# Patient Record
Sex: Female | Born: 1960 | Race: White | Hispanic: No | Marital: Single | Smoking: Never smoker
Health system: Southern US, Community
[De-identification: ages and names within clinical notes are randomized; demographics above are authoritative.]

## PROBLEM LIST (undated history)

## (undated) DIAGNOSIS — E119 Type 2 diabetes mellitus without complications: Secondary | ICD-10-CM

---

## 2005-04-26 ENCOUNTER — Ambulatory Visit (HOSPITAL_COMMUNITY): Admission: RE | Admit: 2005-04-26 | Discharge: 2005-04-26 | Payer: Self-pay | Admitting: Family Medicine

## 2014-02-22 ENCOUNTER — Ambulatory Visit (INDEPENDENT_AMBULATORY_CARE_PROVIDER_SITE_OTHER): Payer: Self-pay | Admitting: Family Medicine

## 2014-02-22 ENCOUNTER — Encounter: Payer: Self-pay | Admitting: Family Medicine

## 2014-02-22 VITALS — BP 144/94 | Temp 98.3°F | Ht 65.0 in | Wt 218.0 lb

## 2014-02-22 DIAGNOSIS — M549 Dorsalgia, unspecified: Secondary | ICD-10-CM

## 2014-02-22 MED ORDER — CHLORZOXAZONE 500 MG PO TABS: 500.0000 mg | ORAL_TABLET | Freq: Three times a day (TID) | ORAL | Status: DC | PRN

## 2014-02-22 NOTE — Progress Notes (Signed)
   Subjective:    Patient ID: Michele Cook, female    DOB: 08/10/1961, 53 y.o.   MRN: [id number]  Shoulder Pain  The pain is present in the left shoulder. This is a new problem. Episode onset: Sunday. There has been no history of extremity trauma (She sneezed, and that is when it started to hurt). The problem occurs daily. Associated symptoms include a limited range of motion. The symptoms are aggravated by standing and activity. She has tried NSAIDS and heat for the symptoms. The treatment provided moderate relief.   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 Lungs clear. Heart regular in rhythm. Positive. Scapular tenderness to deep palpation.      Assessment & Plan:  Impression posterior thoracic strain plan chlorzoxazone 500 3 times a day. Anti-inflammatory medicines. Local measures discussed. Recheck her persists. WSL

## 2014-03-01 ENCOUNTER — Telehealth: Payer: Self-pay | Admitting: *Deleted

## 2014-03-01 MED ORDER — NABUMETONE 750 MG PO TABS: 750.0000 mg | ORAL_TABLET | Freq: Every day | ORAL | Status: DC

## 2014-03-01 NOTE — Telephone Encounter (Signed)
Seen last week for shoulder pain. Taking chlorazone 500 tid. Now having pain in neck and running down left arm. Using heating pain. Med not helping. Please advise. [facility].

## 2014-03-01 NOTE — Telephone Encounter (Signed)
Add relafen 750 bid 28 take no other anti inflam otc

## 2014-03-01 NOTE — Telephone Encounter (Signed)
Discussed with patient. Med sent to pharm.  

## 2014-03-03 ENCOUNTER — Telehealth: Payer: Self-pay | Admitting: Family Medicine

## 2014-03-03 DIAGNOSIS — M25512 Pain in left shoulder: Secondary | ICD-10-CM

## 2014-03-03 NOTE — Telephone Encounter (Signed)
Seen on 02/22/14 for shoulder pain

## 2014-03-03 NOTE — Telephone Encounter (Signed)
Pt called concerned with ongoing pain in L shoulder, sometimes burning, making her have nausea, unable to sleep or to bend over with out pain, has been taking the Relafen since Monday, frustrated that she doesn't seem to be healing, wonders if she needs X-ray,

## 2014-03-04 ENCOUNTER — Telehealth: Payer: Self-pay | Admitting: Family Medicine

## 2014-03-04 MED ORDER — TIZANIDINE HCL 4 MG PO TABS: 4.0000 mg | ORAL_TABLET | Freq: Three times a day (TID) | ORAL | Status: DC | PRN

## 2014-03-04 MED ORDER — ETODOLAC 400 MG PO TABS: 400.0000 mg | ORAL_TABLET | Freq: Two times a day (BID) | ORAL | Status: DC

## 2014-03-04 NOTE — Telephone Encounter (Signed)
lodine 400 bid for 14 d , zanaflex 4 mg numb thirty one up to tid prn, ck in office if persist

## 2014-03-04 NOTE — Telephone Encounter (Signed)
Last seen 03/17, prescribed chloraxazone and relafen

## 2014-03-04 NOTE — Telephone Encounter (Signed)
Patient says that she is still having shoulder and top back pain. She says the medication she was prescribed is not helping too much She feels i from her shoulder to her elbow and here lately its been going down into her side. She said the pain comes and goes and she is sometimes restless at night because of it. Please advise.

## 2014-03-04 NOTE — Telephone Encounter (Signed)
[facility]. Patient was notified.

## 2014-03-11 NOTE — Telephone Encounter (Signed)
This got misplacdd in the shuffle i'm not sure we ever called bk. Call and see how shoulder is doing, if still sig painful would rec xray

## 2014-03-14 NOTE — Telephone Encounter (Signed)
Ok x ray should

## 2014-03-14 NOTE — Telephone Encounter (Signed)
Pain is better but it is still there. She only takes zanaflex once at bedtime instead of TID because it makes her sleepy. Taking etodolac BID. Pt does want to get xray.

## 2014-03-14 NOTE — Telephone Encounter (Signed)
Left shoulder xray order put in. Pt [facility] and [facility] done.

## 2014-03-15 ENCOUNTER — Ambulatory Visit (HOSPITAL_COMMUNITY)
Admission: RE | Admit: 2014-03-15 | Discharge: 2014-03-15 | Disposition: A | Discharge status: Home / Self Care | Payer: Self-pay | Source: Ambulatory Visit | Attending: Family Medicine | Admitting: Family Medicine

## 2014-03-15 DIAGNOSIS — M25519 Pain in unspecified shoulder: Secondary | ICD-10-CM | POA: Insufficient documentation

## 2014-03-16 ENCOUNTER — Telehealth: Payer: Self-pay | Admitting: Family Medicine

## 2014-03-16 NOTE — Telephone Encounter (Signed)
Calling to get results to xray °

## 2014-03-17 NOTE — Telephone Encounter (Signed)
We've not seen in three wks. Call pt advise ov with me this aft or Dr Lorin Picketscott tomorrow

## 2014-03-17 NOTE — Telephone Encounter (Signed)
Pt states she is still in a great deal of pain, almost to much to deal with last night.  Now has ache or burn like feeling going down her arm to her elbow, up through to her chest into her  Breast area, taking her breathe away at moments. Family history of Heart Failure, Cancer, Diabetes, so she is of great concern at this point. Her BP has been slightly elevated since all this pain has started. Last check was 132/92   Also has nausea with it  Feels the medications are helping to a point. Only takes the muscle spasms pill at night before bed because it makes her sluggish. Etodolac is working she assumes, but the pain isn't really going away.   [facility] [place]

## 2014-03-17 NOTE — Telephone Encounter (Signed)
Patient scheduled appointment for tomorrow at front desk.

## 2014-03-18 ENCOUNTER — Encounter: Payer: Self-pay | Admitting: Family Medicine

## 2014-03-18 ENCOUNTER — Ambulatory Visit (INDEPENDENT_AMBULATORY_CARE_PROVIDER_SITE_OTHER): Payer: Self-pay | Admitting: Family Medicine

## 2014-03-18 VITALS — BP 132/90 | Ht 65.0 in | Wt 217.0 lb

## 2014-03-18 DIAGNOSIS — M5010 Cervical disc disorder with radiculopathy, unspecified cervical region: Secondary | ICD-10-CM

## 2014-03-18 DIAGNOSIS — M5412 Radiculopathy, cervical region: Secondary | ICD-10-CM

## 2014-03-18 DIAGNOSIS — R030 Elevated blood-pressure reading, without diagnosis of hypertension: Secondary | ICD-10-CM

## 2014-03-18 MED ORDER — NORTRIPTYLINE HCL 10 MG PO CAPS: 10.0000 mg | ORAL_CAPSULE | Freq: Every day | ORAL | Status: DC

## 2014-03-18 NOTE — Progress Notes (Addendum)
   Subjective:    Patient ID: Michele Cook, female    DOB: 06/02/1961, 53 y.o.   MRN: [id number]  HPI Patient is here today for a repeat visit from 3/17.   She has been dealing with left, shoulder pain for 3 weeks now.  It hurts her w/ or w/o activity. It hurts for her to bend over, or lift her arm. Worse with movement. Pain @ level 8  She can touch and tell you where in her shoulder it hurts. It hurts to the touch.  This all happened when she blew her nose 3 weeks ago.  The heating pad helps.  The patient denies any rash she does states is worse with movement but the pain goes from the trapezius into the shoulder down into triceps down into the arm sometimes into the hand she denies any unilateral numbness or weakness. His made it difficult for her to do her job. She does not have insurance so therefore she does not want to have any advanced testing at this point   Review of Systems     See above. Denies breathing difficulties chest pain nausea vomiting Objective:   Physical Exam Tenderness in the trapezius with subjective discomfort down the left arm intrinsic muscles of the hands normal. No sign of any type of palsy. No spasms. Pulses are good.       Assessment & Plan:  Cervical radiculopathy-I. believe that this patient would benefit from nortriptyline at nighttime start off 10 mg each bedtime advance to 20 mg depending on how she does followup again in 4 weeks' time patient was told if she starts having significant numbness or weakness she would need to go ahead and go through testing regardless of insurance coverage. She seemed to understand this. Followup if any other troubles otherwise recheck 4 weeks  Blood pressure borderline dietary measures exercise discussed recheck blood pressure again in 4 weeks

## 2014-03-18 NOTE — Patient Instructions (Signed)
DASH Diet  The DASH diet stands for "Dietary Approaches to Stop Hypertension." It is a healthy eating plan that has been shown to reduce high blood pressure (hypertension) in as little as 14 days, while also possibly providing other significant health benefits. These other health benefits include reducing the risk of breast cancer after menopause and reducing the risk of type 2 diabetes, heart disease, colon cancer, and stroke. Health benefits also include weight loss and slowing kidney failure in patients with chronic kidney disease.   DIET GUIDELINES  · Limit salt (sodium). Your diet should contain less than 1500 mg of sodium daily.  · Limit refined or processed carbohydrates. Your diet should include mostly whole grains. Desserts and added sugars should be used sparingly.  · Include small amounts of heart-healthy fats. These types of fats include nuts, oils, and tub margarine. Limit saturated and trans fats. These fats have been shown to be harmful in the body.  CHOOSING FOODS   The following food groups are based on a 2000 calorie diet. See your Registered Dietitian for individual calorie needs.  Grains and Grain Products (6 to 8 servings daily)  · Eat More Often: Whole-wheat bread, brown rice, whole-grain or wheat pasta, quinoa, popcorn without added fat or salt (air popped).  · Eat Less Often: White bread, white pasta, white rice, cornbread.  Vegetables (4 to 5 servings daily)  · Eat More Often: Fresh, frozen, and canned vegetables. Vegetables may be raw, steamed, roasted, or grilled with a minimal amount of fat.  · Eat Less Often/Avoid: Creamed or fried vegetables. Vegetables in a cheese sauce.  Fruit (4 to 5 servings daily)  · Eat More Often: All fresh, canned (in natural juice), or frozen fruits. Dried fruits without added sugar. One hundred percent fruit juice (½ cup [237 mL] daily).  · Eat Less Often: Dried fruits with added sugar. Canned fruit in light or heavy syrup.  Lean Meats, Fish, and Poultry (2  servings or less daily. One serving is 3 to 4 oz [85-114 g]).  · Eat More Often: Ninety percent or leaner ground beef, tenderloin, sirloin. Round cuts of beef, chicken breast, turkey breast. All fish. Grill, bake, or broil your meat. Nothing should be fried.  · Eat Less Often/Avoid: Fatty cuts of meat, turkey, or chicken leg, thigh, or wing. Fried cuts of meat or fish.  Dairy (2 to 3 servings)  · Eat More Often: Low-fat or fat-free milk, low-fat plain or light yogurt, reduced-fat or part-skim cheese.  · Eat Less Often/Avoid: Milk (whole, 2%). Whole milk yogurt. Full-fat cheeses.  Nuts, Seeds, and Legumes (4 to 5 servings per week)  · Eat More Often: All without added salt.  · Eat Less Often/Avoid: Salted nuts and seeds, canned beans with added salt.  Fats and Sweets (limited)  · Eat More Often: Vegetable oils, tub margarines without trans fats, sugar-free gelatin. Mayonnaise and salad dressings.  · Eat Less Often/Avoid: Coconut oils, palm oils, butter, stick margarine, cream, half and half, cookies, candy, pie.  FOR MORE INFORMATION  The Dash Diet Eating Plan: www.dashdiet.org  Document Released: 11/14/2011 Document Revised: 02/17/2012 Document Reviewed: 11/14/2011  ExitCare® Patient Information ©2014 [employer].

## 2014-12-13 ENCOUNTER — Ambulatory Visit (INDEPENDENT_AMBULATORY_CARE_PROVIDER_SITE_OTHER): Payer: Self-pay | Admitting: Family Medicine

## 2014-12-13 ENCOUNTER — Encounter: Payer: Self-pay | Admitting: Family Medicine

## 2014-12-13 VITALS — Temp 98.6°F | Ht 65.0 in | Wt 217.2 lb

## 2014-12-13 DIAGNOSIS — J329 Chronic sinusitis, unspecified: Secondary | ICD-10-CM

## 2014-12-13 MED ORDER — CEPHALEXIN 500 MG PO CAPS: 500.0000 mg | ORAL_CAPSULE | Freq: Three times a day (TID) | ORAL | Status: DC

## 2014-12-13 NOTE — Progress Notes (Signed)
   Subjective:    Patient ID: Michele Harnesseresa C Cook, female    DOB: 02/26/1961, 54 y.o.   MRN: [id number]  Cough This is a new problem. The current episode started 1 to 4 weeks ago. Associated symptoms include headaches and nasal congestion.    mucinex and allegra and sinus med   no sig fevdr  No majpor cough thru the night  Worse after eating  No sig smoke exposure  Headache frontal in nature worse with change of position   Review of Systems  Respiratory: Positive for cough.   Neurological: Positive for headaches.       Objective:   Physical Exam  Alert mild malaise. Vital stable frontal mass or tenderness evident pharynx slight erythema neck supple lungs clear. Heart regular in rhythm.      Assessment & Plan:  Impression acute rhinosinusitis plan antibiotics prescribed. Since Medicare discussed. Warning signs discussed. WSL

## 2015-01-09 ENCOUNTER — Telehealth: Payer: Self-pay | Admitting: Family Medicine

## 2015-01-09 ENCOUNTER — Telehealth: Payer: Self-pay | Admitting: *Deleted

## 2015-01-09 ENCOUNTER — Other Ambulatory Visit: Payer: Self-pay | Admitting: *Deleted

## 2015-01-09 MED ORDER — AMOXICILLIN-POT CLAVULANATE 875-125 MG PO TABS: 1.0000 | ORAL_TABLET | Freq: Two times a day (BID) | ORAL | Status: DC

## 2015-01-09 MED ORDER — CHLORZOXAZONE 500 MG PO TABS: 500.0000 mg | ORAL_TABLET | Freq: Three times a day (TID) | ORAL | Status: DC | PRN

## 2015-01-09 NOTE — Telephone Encounter (Signed)
Was seen on 1/5 for rhinosinusitis. No fever, wheezing, nor SOB. Given Keflex.

## 2015-01-09 NOTE — Telephone Encounter (Signed)
Aug 875 bis ten d, chlorzoxazone 500 one tid prn spasm, let pt know if you spk with her coughing can cause spasm tendencies to act up

## 2015-01-09 NOTE — Telephone Encounter (Signed)
Pt requesting a cream or muscle relaxer for her back. She states she was seen last march and did xrays and could never find out what was wrong.

## 2015-01-09 NOTE — Telephone Encounter (Signed)
Patient was seen 1/5 and given prescription for cephalexin 500 mg it helped for awhile but came back and cough is worst especially at night.She states coughing up white lumps of cold can you call in something else and also want something for muscle spasms called into [facility].

## 2015-01-09 NOTE — Telephone Encounter (Signed)
Seen 12/13/14. Dx rhinosinusitis. Prescribed keflex 500 one tid. [facility] to get more info about symptoms and why pt requesting muscle relaxer.

## 2015-01-09 NOTE — Telephone Encounter (Signed)
Discussed with patient. meds sent to pharm.  

## 2015-04-12 ENCOUNTER — Encounter: Payer: Self-pay | Admitting: Nurse Practitioner

## 2015-04-12 ENCOUNTER — Ambulatory Visit (INDEPENDENT_AMBULATORY_CARE_PROVIDER_SITE_OTHER): Payer: Self-pay | Admitting: Nurse Practitioner

## 2015-04-12 VITALS — BP 138/84 | Temp 98.4°F | Ht 65.0 in | Wt 215.0 lb

## 2015-04-12 DIAGNOSIS — N912 Amenorrhea, unspecified: Secondary | ICD-10-CM

## 2015-04-12 DIAGNOSIS — Z13220 Encounter for screening for lipoid disorders: Secondary | ICD-10-CM

## 2015-04-12 DIAGNOSIS — R350 Frequency of micturition: Secondary | ICD-10-CM

## 2015-04-12 DIAGNOSIS — R5383 Other fatigue: Secondary | ICD-10-CM

## 2015-04-12 LAB — POCT URINALYSIS DIPSTICK
PH UA: 6
Spec Grav, UA: 1.015

## 2015-04-12 NOTE — Patient Instructions (Signed)
Soy Flaxseed Black cohosh 

## 2015-04-14 ENCOUNTER — Telehealth: Payer: Self-pay | Admitting: Nurse Practitioner

## 2015-04-14 DIAGNOSIS — R232 Flushing: Secondary | ICD-10-CM

## 2015-04-14 DIAGNOSIS — D649 Anemia, unspecified: Secondary | ICD-10-CM

## 2015-04-14 LAB — URINE CULTURE

## 2015-04-14 NOTE — Telephone Encounter (Signed)
Pt is calling to see if you would all her about the labs you want  Her to do, she went to get them done but she was told by [facility] (since she is self pay) that she would need to pay up front $681 in order To get any labs. She offered partial payment but was told no.   How do we help people like this?   Please advise pt

## 2015-04-14 NOTE — Telephone Encounter (Signed)
#  1: we can narrow this down as much as possible to just basic labs #2: she can try [facility] to see if they can give her a better out of pocket cost; a few of the labs may be expensive but [facility] used to give discounts on regular everyday labs.

## 2015-04-14 NOTE — Telephone Encounter (Signed)
Neither lab will draw the labs with out payment in full before they draw the blood

## 2015-04-16 ENCOUNTER — Encounter: Payer: Self-pay | Admitting: Nurse Practitioner

## 2015-04-16 NOTE — Progress Notes (Signed)
Subjective:  Presents for several complaints including urinary frequency over the past week and a half. Also having fatigue and hot flashes off-and-on for the past month. No relief with estroven. No fever. Minimal dysuria. No nausea vomiting. No constipation or diarrhea. No abdominal pain. Mild low back pain at times. Has not had a cycle in over a year. Sees Dr. Manson PasseyBrown in the summer for her physical.  Objective:   BP 138/84 mmHg  Temp(Src) 98.4 F (36.9 C) (Oral)  Ht 5\' 5"  (1.651 m)  Wt 215 lb (97.523 kg)  BMI 35.78 kg/m2 NAD. Alert, oriented. Lungs clear. Heart regular rate rhythm. No CVA or flank tenderness. Abdomen soft nondistended nontender. Results for orders placed or performed in visit on 04/12/15  Urine culture  Result Value Ref Range   Urine Culture, Routine Final report    Result 1 Comment   POCT urinalysis dipstick  Result Value Ref Range   Color, UA     Clarity, UA     Glucose, UA     Bilirubin, UA     Ketones, UA     Spec Grav, UA 1.015    Blood, UA     pH, UA 6.0    Protein, UA     Urobilinogen, UA     Nitrite, UA     Leukocytes, UA     Urine micro-negative.  Assessment: Urinary frequency - Plan: POCT urinalysis dipstick, Urine culture, Hemoglobin A1c, POCT Glucose (Device for Home Use)  Other fatigue - Plan: Hepatic function panel, Basic metabolic panel, TSH, Vit D  25 hydroxy (rtn osteoporosis monitoring), Hemoglobin A1c  Amenorrhea - Plan: CBC with Differential/Platelet, FSH, LH, Estradiol  Screening, lipid - Plan: Lipid panel  Plan: Discussed natural alternatives to HRT per patient request. Lab work pending. Encourage activity healthy diet and weight loss. Call back if symptoms worsen or persist.

## 2015-04-17 NOTE — Telephone Encounter (Signed)
Left message to return call 

## 2015-04-17 NOTE — Telephone Encounter (Signed)
This is not ideal but several of these labs are expensive. I recommend that we cut it down to the "bare bones" and get met 7, lipid profile, liver profile and TSH. Please order these if she agrees.

## 2015-04-18 NOTE — Telephone Encounter (Signed)
Add CBC with diff to labs. These are the most basic ones we run which will rule out a lot of problems.

## 2015-04-18 NOTE — Telephone Encounter (Signed)
She just wants to do the labs directly related to hot flashes and anemia-no others

## 2015-04-18 NOTE — Telephone Encounter (Signed)
Patient states she thinks she is anemic and that causing weakness- has hx in past of this and patient also very troubled by hot flashes and wants to direct blood work toward these 2 concerns

## 2015-04-21 NOTE — Telephone Encounter (Signed)
These are the most expensive out of the tests we ordered but here it goes: please reorder CBC, LH, FSH and estradiol levels. Thanks.

## 2015-04-21 NOTE — Telephone Encounter (Signed)
bw orders put in. Pt notified on voicemail.  

## 2015-04-28 ENCOUNTER — Telehealth: Payer: Self-pay | Admitting: Nurse Practitioner

## 2015-04-28 NOTE — Telephone Encounter (Signed)
Error

## 2015-05-02 LAB — HEPATIC FUNCTION PANEL
ALT: 23 IU/L (ref 0–32)
AST: 23 IU/L (ref 0–40)
Albumin: 4.2 g/dL (ref 3.5–5.5)
Alkaline Phosphatase: 89 IU/L (ref 39–117)
BILIRUBIN, DIRECT: 0.19 mg/dL (ref 0.00–0.40)
Bilirubin Total: 0.8 mg/dL (ref 0.0–1.2)
Total Protein: 6.5 g/dL (ref 6.0–8.5)

## 2015-05-02 LAB — CBC WITH DIFFERENTIAL/PLATELET
BASOS ABS: 0.1 10*3/uL (ref 0.0–0.2)
Basos: 1 %
EOS (ABSOLUTE): 0.1 10*3/uL (ref 0.0–0.4)
EOS: 2 %
HEMATOCRIT: 44.9 % (ref 34.0–46.6)
HEMOGLOBIN: 15.2 g/dL (ref 11.1–15.9)
IMMATURE GRANULOCYTES: 0 %
Immature Grans (Abs): 0 10*3/uL (ref 0.0–0.1)
LYMPHS: 32 %
Lymphocytes Absolute: 1.6 10*3/uL (ref 0.7–3.1)
MCH: 29 pg (ref 26.6–33.0)
MCHC: 33.9 g/dL (ref 31.5–35.7)
MCV: 86 fL (ref 79–97)
MONOCYTES: 7 %
MONOS ABS: 0.4 10*3/uL (ref 0.1–0.9)
NEUTROS ABS: 2.8 10*3/uL (ref 1.4–7.0)
Neutrophils: 58 %
Platelets: 218 10*3/uL (ref 150–379)
RBC: 5.24 x10E6/uL (ref 3.77–5.28)
RDW: 13.4 % (ref 12.3–15.4)
WBC: 5 10*3/uL (ref 3.4–10.8)

## 2015-05-02 LAB — BASIC METABOLIC PANEL
BUN/Creatinine Ratio: 18 (ref 9–23)
BUN: 13 mg/dL (ref 6–24)
CO2: 22 mmol/L (ref 18–29)
Calcium: 9.3 mg/dL (ref 8.7–10.2)
Chloride: 102 mmol/L (ref 97–108)
Creatinine, Ser: 0.74 mg/dL (ref 0.57–1.00)
GFR calc Af Amer: 108 mL/min/{1.73_m2} (ref >59)
GFR, EST NON AFRICAN AMERICAN: 93 mL/min/{1.73_m2} (ref >59)
GLUCOSE: 114 mg/dL — AB (ref 65–99)
POTASSIUM: 4.3 mmol/L (ref 3.5–5.2)
Sodium: 142 mmol/L (ref 134–144)

## 2015-05-02 LAB — LIPID PANEL
Chol/HDL Ratio: 4.1 ratio units (ref 0.0–4.4)
Cholesterol, Total: 199 mg/dL (ref 100–199)
HDL: 49 mg/dL (ref >39)
LDL Calculated: 115 mg/dL — ABNORMAL HIGH (ref 0–99)
Triglycerides: 174 mg/dL — ABNORMAL HIGH (ref 0–149)
VLDL CHOLESTEROL CAL: 35 mg/dL (ref 5–40)

## 2015-05-02 LAB — HEMOGLOBIN A1C
Est. average glucose Bld gHb Est-mCnc: 117 mg/dL
Hgb A1c MFr Bld: 5.7 % — ABNORMAL HIGH (ref 4.8–5.6)

## 2015-05-08 ENCOUNTER — Encounter: Payer: Self-pay | Admitting: Nurse Practitioner

## 2015-05-08 DIAGNOSIS — R7303 Prediabetes: Secondary | ICD-10-CM | POA: Insufficient documentation

## 2015-05-11 ENCOUNTER — Other Ambulatory Visit: Payer: Self-pay | Admitting: Nurse Practitioner

## 2015-05-11 MED ORDER — METFORMIN HCL 500 MG PO TABS: 500.0000 mg | ORAL_TABLET | Freq: Two times a day (BID) | ORAL | Status: DC

## 2015-10-13 ENCOUNTER — Other Ambulatory Visit: Payer: Self-pay | Admitting: Family Medicine

## 2015-11-03 ENCOUNTER — Other Ambulatory Visit: Payer: Self-pay | Admitting: Family Medicine

## 2015-11-03 DIAGNOSIS — Z1231 Encounter for screening mammogram for malignant neoplasm of breast: Secondary | ICD-10-CM

## 2015-11-13 ENCOUNTER — Ambulatory Visit (HOSPITAL_COMMUNITY)
Admission: RE | Admit: 2015-11-13 | Discharge: 2015-11-13 | Disposition: A | Discharge status: Home / Self Care | Payer: Medicaid Other | Source: Ambulatory Visit | Attending: Family Medicine | Admitting: Family Medicine

## 2015-11-13 DIAGNOSIS — Z1231 Encounter for screening mammogram for malignant neoplasm of breast: Secondary | ICD-10-CM

## 2015-11-20 ENCOUNTER — Telehealth: Payer: Self-pay | Admitting: Family Medicine

## 2015-11-20 NOTE — Telephone Encounter (Signed)
Would like the results to her mammogram please

## 2015-11-20 NOTE — Telephone Encounter (Signed)
[facility]

## 2015-11-20 NOTE — Telephone Encounter (Signed)
mammo is fine, [facility]

## 2015-11-21 NOTE — Telephone Encounter (Signed)
Discussed with pt

## 2016-02-15 ENCOUNTER — Encounter: Payer: Self-pay | Admitting: Family Medicine

## 2016-02-15 ENCOUNTER — Ambulatory Visit (INDEPENDENT_AMBULATORY_CARE_PROVIDER_SITE_OTHER): Payer: Self-pay | Admitting: Family Medicine

## 2016-02-15 VITALS — BP 110/80 | Temp 98.9°F | Ht 65.0 in | Wt 206.1 lb

## 2016-02-15 DIAGNOSIS — J111 Influenza due to unidentified influenza virus with other respiratory manifestations: Secondary | ICD-10-CM

## 2016-02-15 DIAGNOSIS — J019 Acute sinusitis, unspecified: Secondary | ICD-10-CM

## 2016-02-15 DIAGNOSIS — B9689 Other specified bacterial agents as the cause of diseases classified elsewhere: Secondary | ICD-10-CM

## 2016-02-15 MED ORDER — AMOXICILLIN-POT CLAVULANATE 875-125 MG PO TABS: 1.0000 | ORAL_TABLET | Freq: Two times a day (BID) | ORAL | Status: DC

## 2016-02-15 NOTE — Progress Notes (Signed)
   Subjective:    Patient ID: Michele Cook, female    DOB: 09/24/1961, 55 y.o.   MRN: [id number]  Fever  This is a new problem. The current episode started in the past 7 days. The problem occurs intermittently. The problem has been unchanged. Associated symptoms include congestion, coughing, headaches and a sore throat. Associated symptoms comments: Chills, weak. She has tried acetaminophen (sudafed) for the symptoms. The treatment provided no relief.   Started on turesday Weds with fever Chills no sweats no v some n Some d yesterday Weak weds and thur Fever today Aching behind the yeye s abd arms  Review of Systems  Constitutional: Positive for fever.  HENT: Positive for congestion and sore throat.   Respiratory: Positive for cough.   Neurological: Positive for headaches.       Objective:   Physical Exam   lungs clear heart regular sinus mild tenderness eardrums normal neck supple patient not toxic      Assessment & Plan:  Influenza-the patient was diagnosed with influenza. Patient/family educated about the flu and warning signs to watch for. If difficulty breathing, severe neck pain and stiffness, cyanosis, disorientation, or progressive worsening then immediately get rechecked at that ER. If progressive symptoms be certain to be rechecked. Supportive measures such as Tylenol/ibuprofen was discussed. No aspirin use in children. And influenza home care instruction sheet was given.  Tamiflu not of use because she's had the symptoms too long Secondary rhinosinusitis antibiotics prescribed warning signs discussed

## 2016-02-15 NOTE — Patient Instructions (Signed)

## 2016-04-22 ENCOUNTER — Ambulatory Visit (INDEPENDENT_AMBULATORY_CARE_PROVIDER_SITE_OTHER): Payer: Self-pay | Admitting: Family Medicine

## 2016-04-22 ENCOUNTER — Encounter: Payer: Self-pay | Admitting: Family Medicine

## 2016-04-22 ENCOUNTER — Ambulatory Visit (HOSPITAL_COMMUNITY)
Admission: RE | Admit: 2016-04-22 | Discharge: 2016-04-22 | Disposition: A | Discharge status: Home / Self Care | Payer: Medicaid Other | Source: Ambulatory Visit | Attending: Family Medicine | Admitting: Family Medicine

## 2016-04-22 VITALS — BP 110/78 | Temp 97.9°F | Ht 65.0 in | Wt 208.0 lb

## 2016-04-22 DIAGNOSIS — R0781 Pleurodynia: Secondary | ICD-10-CM | POA: Insufficient documentation

## 2016-04-22 DIAGNOSIS — M940 Chondrocostal junction syndrome [Tietze]: Secondary | ICD-10-CM

## 2016-04-22 MED ORDER — NAPROXEN 500 MG PO TABS: 500.0000 mg | ORAL_TABLET | Freq: Two times a day (BID) | ORAL | Status: DC

## 2016-04-22 NOTE — Progress Notes (Signed)
   Subjective:    Patient ID: Michele Cook C Rollison, female    DOB: 01/30/1961, 55 y.o.   MRN: [id number]  Back Pain This is a new problem. The current episode started more than 1 month ago. The problem occurs intermittently. The problem is unchanged. The pain is present in the thoracic spine. The quality of the pain is described as aching. Radiates to: rib cage. The pain is moderate. The pain is the same all the time. Stiffness is present all day. She has tried NSAIDs (tylenol) for the symptoms. The treatment provided no relief.   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 Sometimes moving heavy objects notes pain. Anterior costal margin bilateral also posterior mid back and anterolateral back on both sides no cough no fever no chills  No worse with deep breat  Doe not smoke Patient has no other concerns at this time.    Review of Systems  Musculoskeletal: Positive for back pain.   No urinary symptoms    Objective:   Physical Exam  Alert vital stable lungs clear heart rare rhythm no CA tenderness no spinal tenderness some costal margin tenderness left side greater than rate      Assessment & Plan:  Impression chest wall pain likely diagnosis plan Naprosyn twice a day with food when necessary for pain. Chest x-ray. Symptom care discussed WSL

## 2017-01-23 ENCOUNTER — Encounter: Payer: Self-pay | Admitting: Nurse Practitioner

## 2017-01-23 ENCOUNTER — Ambulatory Visit (INDEPENDENT_AMBULATORY_CARE_PROVIDER_SITE_OTHER): Payer: Self-pay | Admitting: Nurse Practitioner

## 2017-01-23 VITALS — BP 124/78 | Temp 97.7°F | Ht 65.0 in | Wt 214.0 lb

## 2017-01-23 DIAGNOSIS — R110 Nausea: Secondary | ICD-10-CM

## 2017-01-23 DIAGNOSIS — R1011 Right upper quadrant pain: Secondary | ICD-10-CM

## 2017-01-23 MED ORDER — ONDANSETRON 8 MG PO TBDP: 8.0000 mg | ORAL_TABLET | Freq: Three times a day (TID) | ORAL | 0 refills | Status: DC | PRN

## 2017-01-23 NOTE — Progress Notes (Signed)
Subjective:  Presents for c/o RUQ abd pain for approximately 6 months. Occurs off/on. Progressive worse and more frequent. Mid back pain. Nausea, no vomiting. Occasional diarrhea. Stools normal color. No acid reflux. Slightly worse with greasy foods. No fevers.   Objective:   BP 124/78   Temp 97.7 F (36.5 C) (Oral)   Ht 5\' 5"  (1.651 m)   Wt 214 lb (97.1 kg)   BMI 35.61 kg/m  NAD. Alert, oriented. Lungs clear. Heart RRR. Abdomen soft, non distended with active BS. Distinct tenderness in the mid RUQ. No rebound. Some guarding. No obvious masses.   Assessment:  Right upper quadrant abdominal pain - Plan: Basic metabolic panel, CBC with Differential/Platelet, Lipase, Hepatic function panel, US Abdomen Complete  Nausea - Plan: Basic metabolic panel, CBC with Differential/Platelet, Lipase, Hepatic function panel, US Abdomen Complete    Plan:   Meds ordered this encounter  Medications  . ondansetron (ZOFRAN-ODT) 8 MG disintegrating tablet    Sig: Take 1 tablet (8 mg total) by mouth every 8 (eight) hours as needed for nausea or vomiting.    Dispense:  30 tablet    Refill:  0    Order Specific Question:   Supervising Provider    Answer:   Riccardo DubinLUKING, WILLIAM S [[id number]]   Labs and US pending. Warning signs reviewed. Patient to call or go to ED in the meantime if worse. Avoid high fat meals.

## 2017-01-24 LAB — LIPASE: Lipase: 27 U/L (ref 14–72)

## 2017-01-24 LAB — CBC WITH DIFFERENTIAL/PLATELET
BASOS: 1 %
Basophils Absolute: 0.1 10*3/uL (ref 0.0–0.2)
EOS (ABSOLUTE): 0.1 10*3/uL (ref 0.0–0.4)
EOS: 3 %
HEMATOCRIT: 43.6 % (ref 34.0–46.6)
HEMOGLOBIN: 14.9 g/dL (ref 11.1–15.9)
IMMATURE GRANULOCYTES: 0 %
Immature Grans (Abs): 0 10*3/uL (ref 0.0–0.1)
LYMPHS ABS: 1.8 10*3/uL (ref 0.7–3.1)
Lymphs: 34 %
MCH: 28.7 pg (ref 26.6–33.0)
MCHC: 34.2 g/dL (ref 31.5–35.7)
MCV: 84 fL (ref 79–97)
MONOCYTES: 7 %
MONOS ABS: 0.4 10*3/uL (ref 0.1–0.9)
Neutrophils Absolute: 3 10*3/uL (ref 1.4–7.0)
Neutrophils: 55 %
Platelets: 231 10*3/uL (ref 150–379)
RBC: 5.2 x10E6/uL (ref 3.77–5.28)
RDW: 13.5 % (ref 12.3–15.4)
WBC: 5.3 10*3/uL (ref 3.4–10.8)

## 2017-01-24 LAB — HEPATIC FUNCTION PANEL
ALBUMIN: 4.4 g/dL (ref 3.5–5.5)
ALK PHOS: 88 IU/L (ref 39–117)
ALT: 21 IU/L (ref 0–32)
AST: 20 IU/L (ref 0–40)
BILIRUBIN, DIRECT: 0.15 mg/dL (ref 0.00–0.40)
Bilirubin Total: 0.6 mg/dL (ref 0.0–1.2)
TOTAL PROTEIN: 6.9 g/dL (ref 6.0–8.5)

## 2017-01-24 LAB — BASIC METABOLIC PANEL
BUN / CREAT RATIO: 23 (ref 9–23)
BUN: 17 mg/dL (ref 6–24)
CO2: 25 mmol/L (ref 18–29)
CREATININE: 0.74 mg/dL (ref 0.57–1.00)
Calcium: 9.2 mg/dL (ref 8.7–10.2)
Chloride: 102 mmol/L (ref 96–106)
GFR calc Af Amer: 106 mL/min/{1.73_m2} (ref >59)
GFR, EST NON AFRICAN AMERICAN: 92 mL/min/{1.73_m2} (ref >59)
Glucose: 93 mg/dL (ref 65–99)
Potassium: 4.6 mmol/L (ref 3.5–5.2)
SODIUM: 142 mmol/L (ref 134–144)

## 2017-01-29 ENCOUNTER — Ambulatory Visit (HOSPITAL_COMMUNITY)
Admission: RE | Admit: 2017-01-29 | Discharge: 2017-01-29 | Disposition: A | Discharge status: Home / Self Care | Payer: Self-pay | Source: Ambulatory Visit | Attending: Nurse Practitioner | Admitting: Nurse Practitioner

## 2017-01-29 DIAGNOSIS — R161 Splenomegaly, not elsewhere classified: Secondary | ICD-10-CM | POA: Insufficient documentation

## 2017-01-29 DIAGNOSIS — R110 Nausea: Secondary | ICD-10-CM | POA: Insufficient documentation

## 2017-01-29 DIAGNOSIS — N200 Calculus of kidney: Secondary | ICD-10-CM | POA: Insufficient documentation

## 2017-01-29 DIAGNOSIS — R1011 Right upper quadrant pain: Secondary | ICD-10-CM | POA: Insufficient documentation

## 2017-01-29 DIAGNOSIS — N281 Cyst of kidney, acquired: Secondary | ICD-10-CM | POA: Insufficient documentation

## 2017-01-30 ENCOUNTER — Telehealth: Payer: Self-pay | Admitting: Family Medicine

## 2017-01-30 DIAGNOSIS — R1011 Right upper quadrant pain: Secondary | ICD-10-CM

## 2017-01-30 NOTE — Telephone Encounter (Signed)
Discussed with pt. Pt wants to know what the next step is. Still having pain. Pt aware carolyn out of office and will be back tomorrow.

## 2017-01-30 NOTE — Telephone Encounter (Signed)
Please schedule HIDA scan for RUQ pain.

## 2017-01-30 NOTE — Telephone Encounter (Signed)
Gallbladder and duct fine, smll cyst in one kideny and small stone in antoher not source of pain and no need for treatment

## 2017-01-30 NOTE — Telephone Encounter (Signed)
Patient requesting results to U/S abdomen she completed yesterday.

## 2017-01-30 NOTE — Telephone Encounter (Signed)
Tried to call pt no answer. Order for HIDA put in. Need to schedule test

## 2017-01-30 NOTE — Telephone Encounter (Signed)
Ordered by carolyn.

## 2017-01-31 ENCOUNTER — Telehealth: Payer: Self-pay | Admitting: Nurse Practitioner

## 2017-01-31 NOTE — Telephone Encounter (Signed)
Got U/S results Pt states still having pain, comes & goes, bloated  SOB some now  [facility]  Home# [phone]  (until about 12:30) Cell# [phone]

## 2017-01-31 NOTE — Telephone Encounter (Signed)
HIDA scan scheduled march 5th at 8am. Register 7:45 at [facility]. Npo after midnight and no pain meds. Pt verbalized understanding. Pt notified if worse to follow up.

## 2017-01-31 NOTE — Telephone Encounter (Signed)
HIDA scan scheduled march 5th at 8am. Register 7:45 at [facility]. Npo after midnight and no pain meds. Pt verbalized understanding. Pt notified if worse to follow up. 

## 2017-01-31 NOTE — Telephone Encounter (Signed)
HIDA scan has been ordered. Waiting on results to decide next course of action.

## 2017-02-10 ENCOUNTER — Ambulatory Visit (HOSPITAL_COMMUNITY)
Admission: RE | Admit: 2017-02-10 | Discharge: 2017-02-10 | Disposition: A | Discharge status: Home / Self Care | Payer: Self-pay | Source: Ambulatory Visit | Attending: Nurse Practitioner | Admitting: Nurse Practitioner

## 2017-02-10 ENCOUNTER — Encounter (HOSPITAL_COMMUNITY): Payer: Self-pay

## 2017-02-10 DIAGNOSIS — R1011 Right upper quadrant pain: Secondary | ICD-10-CM | POA: Insufficient documentation

## 2017-02-10 HISTORY — DX: Type 2 diabetes mellitus without complications: E11.9

## 2017-02-10 MED ORDER — TECHNETIUM TC 99M MEBROFENIN IV KIT
5.0000 | PACK | Freq: Once | INTRAVENOUS | Status: AC | PRN
  Administered 2017-02-10: 5.5 via INTRAVENOUS

## 2017-02-10 MED ORDER — SODIUM CHLORIDE 0.9% FLUSH
INTRAVENOUS | Status: AC
Start: 2017-02-10 — End: 2017-02-10
  Filled 2017-02-10: qty 200

## 2017-02-12 ENCOUNTER — Encounter: Payer: Self-pay | Admitting: Nurse Practitioner

## 2017-02-12 ENCOUNTER — Ambulatory Visit (HOSPITAL_COMMUNITY)
Admission: RE | Admit: 2017-02-12 | Discharge: 2017-02-12 | Disposition: A | Discharge status: Home / Self Care | Payer: Medicaid Other | Source: Ambulatory Visit | Attending: Nurse Practitioner | Admitting: Nurse Practitioner

## 2017-02-12 ENCOUNTER — Ambulatory Visit (INDEPENDENT_AMBULATORY_CARE_PROVIDER_SITE_OTHER): Payer: Self-pay | Admitting: Nurse Practitioner

## 2017-02-12 VITALS — BP 132/88 | Ht 65.0 in | Wt 215.0 lb

## 2017-02-12 DIAGNOSIS — M546 Pain in thoracic spine: Secondary | ICD-10-CM

## 2017-02-12 DIAGNOSIS — G8929 Other chronic pain: Secondary | ICD-10-CM

## 2017-02-12 DIAGNOSIS — M5134 Other intervertebral disc degeneration, thoracic region: Secondary | ICD-10-CM | POA: Insufficient documentation

## 2017-02-12 MED ORDER — CHLORZOXAZONE 500 MG PO TABS: ORAL_TABLET | ORAL | 0 refills | Status: DC

## 2017-02-12 MED ORDER — MELOXICAM 15 MG PO TABS: 15.0000 mg | ORAL_TABLET | Freq: Every day | ORAL | 0 refills | Status: DC

## 2017-02-13 ENCOUNTER — Encounter: Payer: Self-pay | Admitting: Nurse Practitioner

## 2017-02-13 NOTE — Progress Notes (Signed)
Subjective:  Presents to discuss her recent labs and scan results. Her RUQ abd pain has resolved but tends to occur intermittently. Having pain in the mid thoracic area bilat. Cleans houses for a living. Notices increased discomfort with increased activity. Worse with certain movements.   Objective:   BP 132/88   Ht 5\' 5"  (1.651 m)   Wt 215 lb (97.5 kg)   BMI 35.78 kg/m  NAD. Alert, oriented. Lungs clear. Heart RRR. Tenderness in the mid thoracic spinal area and along the mid back area bilat. Tight tender muscles noted.  Reviewed labs, abd US and HIDA scan results with patient.   Assessment:  Chronic bilateral thoracic back pain - Plan: DG Thoracic Spine W/Swimmers, CANCELED: DG Thoracic Spine 4V    Plan:   Meds ordered this encounter  Medications  . meloxicam (MOBIC) 15 MG tablet    Sig: Take 1 tablet (15 mg total) by mouth daily. Prn pain    Dispense:  30 tablet    Refill:  0    Order Specific Question:   Supervising Provider    Answer:   Merlyn AlbertLUKING, WILLIAM S [[id number]]  . chlorzoxazone (PARAFON) 500 MG tablet    Sig: One po qhs prn muscle spasms    Dispense:  30 tablet    Refill:  0    Order Specific Question:   Supervising Provider    Answer:   Riccardo DubinLUKING, WILLIAM S [[id number]]   Ice/heat applications. Stretching. Massage therapy.  Xray pending. Call back if persists. Recommend preventive health physical.

## 2017-02-19 ENCOUNTER — Telehealth: Payer: Self-pay | Admitting: Nurse Practitioner

## 2017-02-19 NOTE — Telephone Encounter (Signed)
Patient says she was given a muscle relaxer to take at night for muscle spasms.  She said she needs to take it sometimes in the day, but it makes her drowsy.  She wants to know if there is something else Eber JonesCarolyn can give her to take during the day.  Also, patient is on Meloxicam, but it is not helping her as much for her inflammation as what Dr. Brett CanalesSteve had put her on at one time.  She said she doesn't know what the medication was, but it may be Naproxen.  Also, when she wakes up first thing in the morning, her back hurts on her left side.  Once she pees it is a little relief, but she is unsure if its related to her kidney cyst.  [facility]

## 2017-02-21 ENCOUNTER — Telehealth: Payer: Self-pay | Admitting: Nurse Practitioner

## 2017-02-21 ENCOUNTER — Other Ambulatory Visit: Payer: Self-pay | Admitting: Nurse Practitioner

## 2017-02-21 MED ORDER — NAPROXEN 500 MG PO TABS: 500.0000 mg | ORAL_TABLET | Freq: Two times a day (BID) | ORAL | 0 refills | Status: DC

## 2017-02-21 NOTE — Telephone Encounter (Signed)
02/19/17 11:16 AM  Note    Patient says she was given a muscle relaxer to take at night for muscle spasms.  She said she needs to take it sometimes in the day, but it makes her drowsy.  She wants to know if there is something else Eber JonesCarolyn can give her to take during the day.  Also, patient is on Meloxicam, but it is not helping her as much for her inflammation as what Dr. Brett CanalesSteve had put her on at one time.  She said she doesn't know what the medication was, but it may be Naproxen.  Also, when she wakes up first thing in the morning, her back hurts on her left side.  Once she pees it is a little relief, but she is unsure if its related to her kidney cyst.  [facility]

## 2017-02-21 NOTE — Telephone Encounter (Signed)
Stop Mobic. Sent in Naproxen. All muscle relaxants have the potential for drowsiness. Not sure if this is a capsule. If not, can she cut it? I reviewed her scan. Neither the cyst nor the stone in her kidney were blocking the flow of urine at that time. If she continues to have symptoms, recommend visit with urine sample. Get seen quicker if fever, severe back or flank pain, pelvic pain.

## 2017-02-21 NOTE — Telephone Encounter (Signed)
Patient calling back about her previous message from Wednesday. Wanted to know if anything had been called in yet?

## 2017-02-21 NOTE — Telephone Encounter (Signed)
Discussed with patient. Patient advised Michele JonesCarolyn recommends toStop Mobic. Sent in Naproxen. All muscle relaxants have the potential for drowsiness. Not sure if this is a capsule. If not, can she cut it?(Patient states she can cut pill and will try half) Michele Jonesarolyn reviewed her scan. Neither the cyst nor the stone in her kidney were blocking the flow of urine at that time. If she continues to have symptoms, recommend visit with urine sample. Get seen quicker if fever, severe back or flank pain, pelvic pain. Patient verbalized understanding.

## 2017-03-25 ENCOUNTER — Ambulatory Visit (INDEPENDENT_AMBULATORY_CARE_PROVIDER_SITE_OTHER): Payer: Self-pay | Admitting: Family Medicine

## 2017-03-25 ENCOUNTER — Encounter: Payer: Self-pay | Admitting: Family Medicine

## 2017-03-25 VITALS — BP 128/86 | Temp 98.6°F | Ht 65.0 in | Wt 217.0 lb

## 2017-03-25 DIAGNOSIS — J310 Chronic rhinitis: Secondary | ICD-10-CM

## 2017-03-25 DIAGNOSIS — J329 Chronic sinusitis, unspecified: Secondary | ICD-10-CM

## 2017-03-25 MED ORDER — FLUTICASONE PROPIONATE 50 MCG/ACT NA SUSP: 2.0000 | Freq: Every day | NASAL | 6 refills | Status: DC

## 2017-03-25 MED ORDER — AMOXICILLIN 500 MG PO CAPS: 500.0000 mg | ORAL_CAPSULE | Freq: Three times a day (TID) | ORAL | 0 refills | Status: DC

## 2017-03-25 NOTE — Progress Notes (Signed)
   Subjective:    Patient ID: Michele Cook, female    DOB: [birth year], 56 y.o.   MRN: [id number]  Sinusitis  This is a new problem. Episode onset: one week. Associated symptoms include congestion, coughing, headaches and sneezing. (Wheezing, scratchy throat) Treatments tried: zyrtec, benadryl.   claritin did not help last yr  Started zyrtec one wk ago, not doing much  Worked out in yard and in the pollen outoors all weekend  Congestion and dranage and tickle cough   Slight sore pos heaache ad t chest   Pos prod gunky      Review of Systems  HENT: Positive for congestion and sneezing.   Respiratory: Positive for cough.   Neurological: Positive for headaches.       Objective:   Physical Exam  Alert, mild malaise. Hydration good Vitals stable. frontal/ maxillary tenderness evident positive nasal congestion. pharynx normal neck supple  lungs clear/no crackles or wheezes. heart regular in rhythm       Assessment & Plan:  Impression rhinosinusitis likely post viral, discussed with patient. plan antibiotics prescribed. Questions answered. Symptomatic care discussed. warning signs discussed. WSL

## 2017-04-04 ENCOUNTER — Telehealth: Payer: Self-pay | Admitting: Family Medicine

## 2017-04-04 MED ORDER — AZITHROMYCIN 250 MG PO TABS: ORAL_TABLET | ORAL | 0 refills | Status: DC

## 2017-04-04 NOTE — Telephone Encounter (Signed)
Patient also wants a prescription for metformin. Patient was on it in the past but chart says the patient stopped it herself several months ago so no longer on med list

## 2017-04-04 NOTE — Telephone Encounter (Signed)
Pt was advised that the abx were sent into the Pharmacy and that she will need an office visit for the metformin. She states that she cannot come in for the appt right and will call back when she can.

## 2017-04-04 NOTE — Telephone Encounter (Signed)
Left message to return call 

## 2017-04-04 NOTE — Telephone Encounter (Signed)
I would recommend azithromycin/Z-Pak-also use Robitussin-DM OTC-if ongoing troubles or not improving needs to follow-up

## 2017-04-04 NOTE — Telephone Encounter (Signed)
Patient was seen on 03/25/17 by Dr. Brett Canales for rhinosinusitis.  She said she still has a hacking cough and would like something called in for this.  [facility]  Also, she needs Rx for Metformin.

## 2017-04-04 NOTE — Telephone Encounter (Signed)
In all due respect this patient does need a follow-up with Dr. Brett Canales to check a hemoglobin A1c before reinitiating last A1c well over a year ago

## 2017-04-04 NOTE — Telephone Encounter (Addendum)
Antibiotic sent electronically  to pharmacy. Patient needs office visit with Dr Brett Canales for prescription of Metformin. Left message for patient to return call

## 2017-04-04 NOTE — Telephone Encounter (Signed)
Finished amoxil. Still having cough. Hard to get it up but when she does it is yellow. No fever, a little sob when coughing. Would like something for cough she can take during the day. She does not have insurance.

## 2017-04-21 ENCOUNTER — Telehealth: Payer: Self-pay | Admitting: Family Medicine

## 2017-04-21 MED ORDER — DOXYCYCLINE HYCLATE 100 MG PO TABS: 100.0000 mg | ORAL_TABLET | Freq: Two times a day (BID) | ORAL | 0 refills | Status: AC

## 2017-04-21 MED ORDER — BENZONATATE 100 MG PO CAPS: ORAL_CAPSULE | ORAL | 0 refills | Status: DC

## 2017-04-21 NOTE — Telephone Encounter (Signed)
Doxy 100 bid ten d tess perles 100 mg 24 one q six hr prn cough, any further intervention will require o v to re assess

## 2017-04-21 NOTE — Telephone Encounter (Signed)
Pt is still coughing up yellow phlegm and has a dry cough. Pt has finished both antibiotics. Please advise.    [facility]

## 2017-04-21 NOTE — Telephone Encounter (Signed)
Left message return call 04/21/17

## 2017-04-21 NOTE — Telephone Encounter (Signed)
Discussed with pt. meds were sent to pharm.
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
## 2017-05-19 ENCOUNTER — Encounter: Payer: Self-pay | Admitting: Nurse Practitioner

## 2017-05-19 ENCOUNTER — Encounter: Payer: Self-pay | Admitting: Family Medicine

## 2017-05-19 ENCOUNTER — Ambulatory Visit (INDEPENDENT_AMBULATORY_CARE_PROVIDER_SITE_OTHER): Payer: Self-pay | Admitting: Nurse Practitioner

## 2017-05-19 VITALS — BP 158/96 | Ht 65.0 in | Wt 217.0 lb

## 2017-05-19 DIAGNOSIS — I10 Essential (primary) hypertension: Secondary | ICD-10-CM | POA: Insufficient documentation

## 2017-05-19 DIAGNOSIS — R7303 Prediabetes: Secondary | ICD-10-CM

## 2017-05-19 DIAGNOSIS — R0609 Other forms of dyspnea: Secondary | ICD-10-CM

## 2017-05-19 DIAGNOSIS — J300 Vasomotor rhinitis: Secondary | ICD-10-CM

## 2017-05-19 LAB — POCT GLYCOSYLATED HEMOGLOBIN (HGB A1C): HEMOGLOBIN A1C: 4.9

## 2017-05-19 MED ORDER — LISINOPRIL 5 MG PO TABS: 5.0000 mg | ORAL_TABLET | Freq: Every day | ORAL | 1 refills | Status: DC

## 2017-05-19 MED ORDER — METFORMIN HCL 500 MG PO TABS: ORAL_TABLET | ORAL | 0 refills | Status: DC

## 2017-05-19 NOTE — Patient Instructions (Addendum)
Neoprene ankle support Take allegra or claritin daily Start flonase, nasacort or rhinocort daily (coupons available online)

## 2017-05-19 NOTE — Progress Notes (Addendum)
Subjective:  Presents for recheck on prediabetes. Taking Metformin once a day. Cannot tolerate twice a day due to diarrhea. Has been under more stress lately. No CP/ischemic type pain but has noticed slight increase in shortness of breath when walking up inclines and stairs for about 4-5 months. Non smoker. Noted during visit that she clears her throat frequently. Denies any sore throat or difficulty swallowing. No acid reflux or abdominal pain. Continues to have some PND. Occasional mild edema LE. Does not check BP outside of office. Strong family history of heart disease, particularly early heart disease in her mother.   Objective:   BP (!) 158/96   Ht 5\' 5"  (1.651 m)   Wt 217 lb (98.4 kg)   BMI 36.11 kg/m  NAD. Alert, oriented. TMs mild clear effusion. Pharynx mildly injected with clear PND noted. Neck supple with minimal adenopathy. Lungs clear. Heart RRR. BP on recheck right arm sitting 146/96. Abdomen soft, non distended, non tender.  Results for orders placed or performed in visit on 05/19/17  POCT glycosylated hemoglobin (Hb A1C)  Result Value Ref Range   Hemoglobin A1C 4.9      Assessment:   Problem List Items Addressed This Visit      Cardiovascular and Mediastinum   Essential hypertension   Relevant Medications   lisinopril (PRINIVIL,ZESTRIL) 5 MG tablet     Other   Prediabetes - Primary   Relevant Orders   POCT glycosylated hemoglobin (Hb A1C) (Completed)    Other Visit Diagnoses    Dyspnea on exertion       Vasomotor rhinitis             Plan:   Meds ordered this encounter  Medications  . DISCONTD: metFORMIN (GLUCOPHAGE) 500 MG tablet    Sig: Take 500 mg by mouth. Pt was instructed to take 500 mg one BID ,but had reaction made her heart feel funny,so pt started taking one daily.  . metFORMIN (GLUCOPHAGE) 500 MG tablet    Sig: One po BID with meals    Dispense:  180 tablet    Refill:  0    Order Specific Question:   Supervising Provider    Answer:   Merlyn AlbertLUKING,  WILLIAM S [[id number]]  . lisinopril (PRINIVIL,ZESTRIL) 5 MG tablet    Sig: Take 1 tablet (5 mg total) by mouth daily.    Dispense:  90 tablet    Refill:  1    Order Specific Question:   Supervising Provider    Answer:   Merlyn AlbertLUKING, WILLIAM S [[id number]]     continue metformin daily. May increase to BID if tolerated. Despite not having health insurance, patient agrees to referral to cardiology for evaluation especially considering family history. Hold on further labs at this time due to cost. Call or go to ED sooner if any problems. Warning signs were reviewed.  Return in about 6 months (around 11/18/2017) for recheck.

## 2017-06-05 ENCOUNTER — Telehealth: Payer: Self-pay | Admitting: Nurse Practitioner

## 2017-06-05 NOTE — Telephone Encounter (Signed)
70 to 90 normal, diarrhea likely not related, rx with otc immodium should fade

## 2017-06-05 NOTE — Telephone Encounter (Signed)
Patient was put on blood pressure medication a few weeks ago by Eber Jonesarolyn.  She said her BP is doing better, but her pulse has been high ever since.  She said the day she started taking the medication it has been 70-90 at times.  She said she also started having diarrhea yesterday but she isn't sure if that has anything to do with it.

## 2017-06-05 NOTE — Telephone Encounter (Signed)
Spoke with patient and informed her per Dr.Steve Luking- 70- 90 pulse is normal. Diarrhea likely not related. Otc immodium for diarrhea. Patient verbalized understanding.

## 2017-06-09 ENCOUNTER — Ambulatory Visit: Payer: Self-pay | Admitting: Cardiology

## 2017-06-09 NOTE — Progress Notes (Deleted)
Clinical Summary Ms. Michele Cook is a 56 y.o.female  1. DOE      *insurance issues?  Past Medical History:  Diagnosis Date  . Diabetes mellitus without complication (HCC)      Allergies  Allergen Reactions  . Sulfa Antibiotics      Current Outpatient Prescriptions  Medication Sig Dispense Refill  . benzonatate (TESSALON PERLES) 100 MG capsule Take 1 tablet by mouth every 6 hours as needed for cough. (Patient not taking: Reported on 05/19/2017) 24 capsule 0  . chlorzoxazone (PARAFON) 500 MG tablet One po qhs prn muscle spasms (Patient taking differently: as needed. One po qhs prn muscle spasms) 30 tablet 0  . fluticasone (FLONASE) 50 MCG/ACT nasal spray Place 2 sprays into both nostrils daily. (Patient not taking: Reported on 05/19/2017) 16 g 6  . lisinopril (PRINIVIL,ZESTRIL) 5 MG tablet Take 1 tablet (5 mg total) by mouth daily. 90 tablet 1  . metFORMIN (GLUCOPHAGE) 500 MG tablet One po BID with meals 180 tablet 0  . naproxen (NAPROSYN) 500 MG tablet Take 1 tablet (500 mg total) by mouth 2 (two) times daily with a meal. (Patient taking differently: Take 500 mg by mouth as needed. ) 30 tablet 0   No current facility-administered medications for this visit.      No past surgical history on file.   Allergies  Allergen Reactions  . Sulfa Antibiotics       Family History  Problem Relation Age of Onset  . Heart disease Mother        bypass surgery 50's  . Colon cancer Mother   . Hypertension Mother   . Hyperlipidemia Mother   . Diabetes Mother   . Hyperlipidemia Father   . Hypertension Father   . Stroke Father   . Heart disease Brother   . Hypertension Brother   . Heart disease Maternal Grandmother   . Heart disease Maternal Grandfather   . Diabetes Paternal Grandmother   . Diabetes Paternal Grandfather   . Heart disease Paternal Grandfather      Social History Michele Cook reports that she has never smoked. She has never used smokeless tobacco. Michele Cook has no alcohol history on file.   Review of Systems CONSTITUTIONAL: No weight loss, fever, chills, weakness or fatigue.  HEENT: Eyes: No visual loss, blurred vision, double vision or yellow sclerae.No hearing loss, sneezing, congestion, runny nose or sore throat.  SKIN: No rash or itching.  CARDIOVASCULAR:  RESPIRATORY: No shortness of breath, cough or sputum.  GASTROINTESTINAL: No anorexia, nausea, vomiting or diarrhea. No abdominal pain or blood.  GENITOURINARY: No burning on urination, no polyuria NEUROLOGICAL: No headache, dizziness, syncope, paralysis, ataxia, numbness or tingling in the extremities. No change in bowel or bladder control.  MUSCULOSKELETAL: No muscle, back pain, joint pain or stiffness.  LYMPHATICS: No enlarged nodes. No history of splenectomy.  PSYCHIATRIC: No history of depression or anxiety.  ENDOCRINOLOGIC: No reports of sweating, cold or heat intolerance. No polyuria or polydipsia.  Marland Kitchen.   Physical Examination There were no vitals filed for this visit. There were no vitals filed for this visit.  Gen: resting comfortably, no acute distress HEENT: no scleral icterus, pupils equal round and reactive, no palptable cervical adenopathy,  CV Resp: Clear to auscultation bilaterally GI: abdomen is soft, non-tender, non-distended, normal bowel sounds, no hepatosplenomegaly MSK: extremities are warm, no edema.  Skin: warm, no rash Neuro:  no focal deficits Psych: appropriate affect   Diagnostic Studies  Assessment and Plan        Arnoldo Lenis, M.D., F.A.C.C.

## 2017-06-25 ENCOUNTER — Encounter: Payer: Self-pay | Admitting: Cardiology

## 2017-06-25 ENCOUNTER — Ambulatory Visit (INDEPENDENT_AMBULATORY_CARE_PROVIDER_SITE_OTHER): Payer: Self-pay | Admitting: Cardiology

## 2017-06-25 VITALS — BP 120/82 | HR 77 | Ht 65.0 in | Wt 215.6 lb

## 2017-06-25 DIAGNOSIS — R0602 Shortness of breath: Secondary | ICD-10-CM

## 2017-06-25 DIAGNOSIS — I10 Essential (primary) hypertension: Secondary | ICD-10-CM

## 2017-06-25 DIAGNOSIS — E119 Type 2 diabetes mellitus without complications: Secondary | ICD-10-CM

## 2017-06-25 MED ORDER — ASPIRIN EC 81 MG PO TBEC: 81.0000 mg | DELAYED_RELEASE_TABLET | Freq: Every day | ORAL | 3 refills | Status: DC

## 2017-06-25 NOTE — Patient Instructions (Signed)
Medication Instructions:  Your physician has recommended you make the following change in your medication:   Begin taking Aspirin 81 mg daily  Labwork: NONE  Testing/Procedures: NONE  Follow-Up: Your physician recommends that you schedule a follow-up appointment PENDING   Any Other Special Instructions Will Be Listed Below (If Applicable).  If you need a refill on your cardiac medications before your next appointment, please call your pharmacy.

## 2017-06-25 NOTE — Progress Notes (Signed)
Clinical Summary Michele Cook is a 56 y.o.female seen as a new consult, referred by Dr Gerda Diss for SOB.   1. SOB - started about 1 year ago - mainly occurs with activity, example walking up 1 flight of stairs at home - no chest pain. No coughing, no wheezing.  - occasional LE edema. No orthopnea   CAD risk factors: father with "heart troubles", mother with CHF, brother with CHF died 3 years ago. DM2, HTN. No smoking history.     2. DM2 - not on statin - not aspirin Past Medical History:  Diagnosis Date  . Diabetes mellitus without complication (HCC)      Allergies  Allergen Reactions  . Sulfa Antibiotics      Current Outpatient Prescriptions  Medication Sig Dispense Refill  . benzonatate (TESSALON PERLES) 100 MG capsule Take 1 tablet by mouth every 6 hours as needed for cough. (Patient not taking: Reported on 05/19/2017) 24 capsule 0  . chlorzoxazone (PARAFON) 500 MG tablet One po qhs prn muscle spasms (Patient taking differently: as needed. One po qhs prn muscle spasms) 30 tablet 0  . fluticasone (FLONASE) 50 MCG/ACT nasal spray Place 2 sprays into both nostrils daily. (Patient not taking: Reported on 05/19/2017) 16 g 6  . lisinopril (PRINIVIL,ZESTRIL) 5 MG tablet Take 1 tablet (5 mg total) by mouth daily. 90 tablet 1  . metFORMIN (GLUCOPHAGE) 500 MG tablet One po BID with meals 180 tablet 0  . naproxen (NAPROSYN) 500 MG tablet Take 1 tablet (500 mg total) by mouth 2 (two) times daily with a meal. (Patient taking differently: Take 500 mg by mouth as needed. ) 30 tablet 0   No current facility-administered medications for this visit.      No past surgical history on file.   Allergies  Allergen Reactions  . Sulfa Antibiotics       Family History  Problem Relation Age of Onset  . Heart disease Mother        bypass surgery 50's  . Colon cancer Mother   . Hypertension Mother   . Hyperlipidemia Mother   . Diabetes Mother   . Hyperlipidemia Father   .  Hypertension Father   . Stroke Father   . Heart disease Brother   . Hypertension Brother   . Heart disease Maternal Grandmother   . Heart disease Maternal Grandfather   . Diabetes Paternal Grandmother   . Diabetes Paternal Grandfather   . Heart disease Paternal Grandfather      Social History Michele Cook reports that she has never smoked. She has never used smokeless tobacco. Michele Cook has no alcohol history on file.   Review of Systems CONSTITUTIONAL: No weight loss, fever, chills, weakness or fatigue.  HEENT: Eyes: No visual loss, blurred vision, double vision or yellow sclerae.No hearing loss, sneezing, congestion, runny nose or sore throat.  SKIN: No rash or itching.  CARDIOVASCULAR: per hpi RESPIRATORY: per hpi GASTROINTESTINAL: No anorexia, nausea, vomiting or diarrhea. No abdominal pain or blood.  GENITOURINARY: No burning on urination, no polyuria NEUROLOGICAL: No headache, dizziness, syncope, paralysis, ataxia, numbness or tingling in the extremities. No change in bowel or bladder control.  MUSCULOSKELETAL: No muscle, back pain, joint pain or stiffness.  LYMPHATICS: No enlarged nodes. No history of splenectomy.  PSYCHIATRIC: No history of depression or anxiety.  ENDOCRINOLOGIC: No reports of sweating, cold or heat intolerance. No polyuria or polydipsia.  Marland Kitchen   Physical Examination Vitals:   06/25/17 1000 06/25/17 1006  BP:  124/86 120/82  Pulse: 77    Vitals:   06/25/17 1000  Weight: 215 lb 9.6 oz (97.8 kg)  Height: 5\' 5"  (1.651 m)    Gen: resting comfortably, no acute distress HEENT: no scleral icterus, pupils equal round and reactive, no palptable cervical adenopathy,  CV: RRR, no m/r/g, no jvd Resp: Clear to auscultation bilaterally GI: abdomen is soft, non-tender, non-distended, normal bowel sounds, no hepatosplenomegaly MSK: extremities are warm, no edema.  Skin: warm, no rash Neuro:  no focal deficits Psych: appropriate affect     Assessment and  Plan  1. SOB - multiple CAD risk factors and strong family history of heart disease - ekg in clinic shows SR with incomplete RBBB, no sspecific ischemic changes - we will refer her to cone assistance since she does not have insurance. Ideally would start workup with an echo, pending results consider a GXT - she is to call us to update us on status  2. DM2 - recommend starting ASA 81mg  daily - in setting of DM2 would recommend considering statin, defer to pcp  3. HTN - at goal, continue current meds    Patient to notify once she has been evaluated for cone assistance. Likely order echo at that time, pending results consider GXT. F/u is pending.       Antoine PocheJonathan F. Annalycia Done, M.D.

## 2017-07-18 ENCOUNTER — Telehealth: Payer: Self-pay | Admitting: Family Medicine

## 2017-07-18 NOTE — Telephone Encounter (Signed)
Discussed with pt. Pt verbalized understanding.  °

## 2017-07-18 NOTE — Telephone Encounter (Signed)
As long as top numb stays above 100 wth no symtoms would continue, that b p med does not affect heart rat, if drops elow 100 would cut lisinopril dose in half

## 2017-07-18 NOTE — Telephone Encounter (Signed)
Pt's been on new BP medicine for a couple months & her cardiologist put her on 81mg  aspirin about 3 weeks ago  Lowest it's been recently was 103/68 p[ulse 52 (no odd feelings or dizzy with low reading) - this just happening in the past 3 weeks  Pt's worried because she don't know how low is too low  Please advise     [facility]

## 2017-07-18 NOTE — Telephone Encounter (Signed)
Left message to return call 

## 2017-08-26 ENCOUNTER — Other Ambulatory Visit: Payer: Self-pay | Admitting: Nurse Practitioner

## 2017-08-26 NOTE — Telephone Encounter (Signed)
Last seen 05/19/17 

## 2017-09-01 ENCOUNTER — Telehealth: Payer: Self-pay | Admitting: Family Medicine

## 2017-09-01 NOTE — Telephone Encounter (Signed)
Pt called stating that her bp has been dropping to 98/73. Pt states that she has started cutting her bp pill in half this morning. Pt states that she was told to cut it in half if it dropped below 100. Pt states that she does not feel any different when it drops.

## 2017-09-01 NOTE — Telephone Encounter (Signed)
Reviewed cardiology and last note to [facility]. Continue 1/2 tab and monitor BP. If top number continues to drop below 100, stop BP med and let us know. Use care with sudden position changes.

## 2017-09-02 NOTE — Telephone Encounter (Signed)
Patient advised that Michele Cook reviewed cardiology and last note to [place]. Continue 1/2 tab and monitor BP. If top number continues to drop below 100, stop BP med and let us know. Use care with sudden position changes. Patient verbalized understanding.

## 2017-10-15 ENCOUNTER — Ambulatory Visit: Payer: Self-pay | Admitting: Nurse Practitioner

## 2017-10-15 VITALS — BP 122/76 | Temp 98.5°F | Ht 65.0 in | Wt 220.0 lb

## 2017-10-15 DIAGNOSIS — B9689 Other specified bacterial agents as the cause of diseases classified elsewhere: Secondary | ICD-10-CM

## 2017-10-15 DIAGNOSIS — J069 Acute upper respiratory infection, unspecified: Secondary | ICD-10-CM

## 2017-10-15 MED ORDER — AMOXICILLIN-POT CLAVULANATE 875-125 MG PO TABS: 1.0000 | ORAL_TABLET | Freq: Two times a day (BID) | ORAL | 0 refills | Status: DC

## 2017-10-18 ENCOUNTER — Encounter: Payer: Self-pay | Admitting: Nurse Practitioner

## 2017-10-18 NOTE — Progress Notes (Signed)
Subjective: Presents for complaints of a head cold and sinus congestion that began 5 days ago.  Fever has improved.  Slight sore throat.  Left-sided facial a headache at times.  Runny nose.  Worsening cough over the past couple of days now producing yellow green mucus.  Slight ear pain.  No wheezing.  No relief with OTC meds.  Objective:   BP 122/76   Temp 98.5 F (36.9 C) (Oral)   Ht 5\' 5"  (1.651 m)   Wt 220 lb (99.8 kg)   BMI 36.61 kg/m  NAD.  Alert, oriented.  TMs clear effusion, no erythema.  Pharynx injected with green PND noted.  Neck supple with mild soft anterior adenopathy.  Lungs clear.  Heart regular rate and rhythm.  Assessment:  Bacterial upper respiratory infection    Plan:   Meds ordered this encounter  Medications  . amoxicillin-clavulanate (AUGMENTIN) 875-125 MG tablet    Sig: Take 1 tablet 2 (two) times daily by mouth.    Dispense:  20 tablet    Refill:  0    Order Specific Question:   Supervising Provider    Answer:   Merlyn AlbertLUKING, WILLIAM S [[id number]]   Continue OTC meds as directed for congestion and cough.  Call back in 7-10 days if no improvement, sooner if worse.

## 2017-10-27 ENCOUNTER — Telehealth: Payer: Self-pay | Admitting: Family Medicine

## 2017-10-27 ENCOUNTER — Other Ambulatory Visit: Payer: Self-pay | Admitting: Nurse Practitioner

## 2017-10-27 MED ORDER — LEVOFLOXACIN 500 MG PO TABS: 500.0000 mg | ORAL_TABLET | Freq: Every day | ORAL | 0 refills | Status: DC

## 2017-10-27 NOTE — Telephone Encounter (Signed)
Message for [name]--Was seen 11/7 and still has bad cough and wanting something else called into  [facility].

## 2017-10-27 NOTE — Telephone Encounter (Addendum)
Patient notified an verbalized understanding. 

## 2017-10-27 NOTE — Telephone Encounter (Signed)
Stop Augmentin. Sent in another antibiotic. Call back if cough persists.

## 2018-01-07 ENCOUNTER — Telehealth: Payer: Self-pay | Admitting: Family Medicine

## 2018-01-07 NOTE — Telephone Encounter (Signed)
Patient states that off and on she will get a  little weak or dizzy and her bp will be like 119/81. Then other times 135/87 and it does this every now and again and she wondered if she should be concerned. Patient seen heart specialist and he wanted stress test and echo but she is waiting for patient assistance to get that scheduled. She is currently taking lisinopril 5 mg  1/2 a tablet a day

## 2018-01-07 NOTE — Telephone Encounter (Signed)
Also needs refill of Metformin

## 2018-01-07 NOTE — Telephone Encounter (Signed)
Pt called stating that her blood pressure keeps going up and down over the last couple weeks. Pt would like a call back regarding this.

## 2018-01-08 ENCOUNTER — Other Ambulatory Visit: Payer: Self-pay | Admitting: Nurse Practitioner

## 2018-01-08 MED ORDER — METFORMIN HCL 500 MG PO TABS: 500.0000 mg | ORAL_TABLET | Freq: Two times a day (BID) | ORAL | 1 refills | Status: DC

## 2018-01-08 NOTE — Telephone Encounter (Signed)
There are many potential causes to her symptoms; BP is only one of many. If cardiology recommends a work up, hopefully she can get that done soon. She can make an office visit if she wants us to evaluate further. Will send in refill as requested.

## 2018-01-08 NOTE — Progress Notes (Unsigned)
No pharmacy mentioned in phone note. [facility]

## 2018-01-08 NOTE — Progress Notes (Signed)
Patient is aware 

## 2018-01-08 NOTE — Telephone Encounter (Signed)
Patient is aware and will contact the cardiologist for further work up.

## 2018-01-09 ENCOUNTER — Telehealth: Payer: Self-pay | Admitting: Cardiology

## 2018-01-09 ENCOUNTER — Other Ambulatory Visit: Payer: Self-pay

## 2018-01-09 DIAGNOSIS — R0602 Shortness of breath: Secondary | ICD-10-CM

## 2018-01-09 NOTE — Telephone Encounter (Signed)
Returned pt call. She stated she was wanting to proceed with having an echocardiogram done at this time. I looked at LOV with Dr. Wyline MoodBranch and he did state she needed to have one.  (Patient to notify once she has been evaluated for cone assistance. Likely order echo at that time, pending results consider GXT. F/u is pending. ) Order put in for echo. Will forward to front office to schedule. Will also notify Dr. Wyline MoodBranch that she is ready to have test done.

## 2018-01-09 NOTE — Telephone Encounter (Signed)
Pt lvm concerning her Stress Test and Echo she's supposed to be scheduled for. Please give her a call @ [phone], there are no orders placed at this time.

## 2018-01-12 NOTE — Telephone Encounter (Signed)
Thank you for update, agree with ordering echo   Dominga FerryJ Ules Marsala MD

## 2018-01-19 ENCOUNTER — Ambulatory Visit (HOSPITAL_COMMUNITY)
Admission: RE | Admit: 2018-01-19 | Discharge: 2018-01-19 | Disposition: A | Discharge status: Home / Self Care | Payer: Self-pay | Source: Ambulatory Visit | Attending: Cardiology | Admitting: Cardiology

## 2018-01-19 DIAGNOSIS — R0602 Shortness of breath: Secondary | ICD-10-CM | POA: Insufficient documentation

## 2018-01-19 DIAGNOSIS — R7303 Prediabetes: Secondary | ICD-10-CM | POA: Insufficient documentation

## 2018-01-19 DIAGNOSIS — I119 Hypertensive heart disease without heart failure: Secondary | ICD-10-CM | POA: Insufficient documentation

## 2018-01-19 LAB — ECHOCARDIOGRAM COMPLETE
E/e' ratio: 8.56
EWDT: 208 ms
FS: 36 % (ref 28–44)
IV/PV OW: 1.04
LA diam end sys: 30 mm
LADIAMINDEX: 1.37 cm/m2
LASIZE: 30 mm
LAVOL: 38.2 mL
LAVOLA4C: 41.1 mL
LAVOLIN: 17.5 mL/m2
LV E/e' medial: 8.56
LV E/e'average: 8.56
LV SIMPSON'S DISK: 57
LV TDI E'LATERAL: 8.81
LV sys vol: 30 mL
LVDIAVOL: 70 mL (ref 46–106)
LVDIAVOLIN: 32 mL/m2
LVELAT: 8.81 cm/s
LVOT SV: 58 mL
LVOT VTI: 20.5 cm
LVOT area: 2.84 cm2
LVOT diameter: 19 mm
LVOT peak grad rest: 3 mmHg
LVOT peak vel: 85.6 cm/s
LVSYSVOLIN: 14 mL/m2
Lateral S' vel: 10.1 cm/s
MV Dec: 208
MV pk E vel: 75.4 m/s
MVPG: 2 mmHg
MVPKAVEL: 84.3 m/s
PW: 9.88 mm — AB (ref 0.6–1.1)
Stroke v: 40 ml
TAPSE: 20.4 mm
TDI e' medial: 8.27

## 2018-01-19 NOTE — Progress Notes (Signed)
*  PRELIMINARY RESULTS* Echocardiogram 2D Echocardiogram has been performed.  Stacey DrainWhite, Jamare Vanatta J 01/19/2018, 11:00 AM

## 2018-01-20 NOTE — Telephone Encounter (Signed)
Called pt., no answer. Left message for pt to return call.  

## 2018-01-20 NOTE — Telephone Encounter (Signed)
Can she see us back in clinic in 3-4 weeks, needs to reassess and decide if we need to do additional testing for her symptoms   Dominga FerryJ Hamda Klutts MD

## 2018-01-20 NOTE — Telephone Encounter (Signed)
-----   Message from Antoine PocheJonathan F Branch, MD sent at 01/20/2018 11:30 AM EST ----- Echo overall looks good, normal heart function. Is she still having issues with SOB? Are they any better or worst than when I had seen her in clinic   Dominga FerryJ Branch MD

## 2018-01-20 NOTE — Telephone Encounter (Signed)
Pt made aware of echo results. She voiced understanding. She stated that her SOB is unchanged. 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 I let pt know that I would let you know about her SOB.

## 2018-02-09 ENCOUNTER — Ambulatory Visit (INDEPENDENT_AMBULATORY_CARE_PROVIDER_SITE_OTHER): Payer: Self-pay | Admitting: Nurse Practitioner

## 2018-02-09 ENCOUNTER — Encounter: Payer: Self-pay | Admitting: Nurse Practitioner

## 2018-02-09 VITALS — BP 132/80 | Temp 98.3°F | Ht 65.0 in | Wt 224.0 lb

## 2018-02-09 DIAGNOSIS — R300 Dysuria: Secondary | ICD-10-CM

## 2018-02-09 LAB — POCT URINALYSIS DIPSTICK
PH UA: 6 (ref 5.0–8.0)
Spec Grav, UA: <=1.005 — AB (ref 1.010–1.025)

## 2018-02-09 MED ORDER — CIPROFLOXACIN HCL 500 MG PO TABS: 500.0000 mg | ORAL_TABLET | Freq: Two times a day (BID) | ORAL | 0 refills | Status: DC

## 2018-02-09 NOTE — Patient Instructions (Addendum)
AZO as directed for 48 hours then stop 

## 2018-02-10 ENCOUNTER — Encounter: Payer: Self-pay | Admitting: Nurse Practitioner

## 2018-02-10 NOTE — Progress Notes (Signed)
Subjective: Presents for complaints of burning with urination with urgency and frequency that began yesterday afternoon.  No fever.  Had some light red color x1.  None since.  No history of recent UTI.  No back or abdominal pain.  No vaginal discharge.  No new sexual partners.  No nausea vomiting.  Has a remote history of kidney stones.  Taking fluids well.  Objective:   BP 132/80   Temp 98.3 F (36.8 C) (Oral)   Ht 5\' 5"  (1.651 m)   Wt 224 lb (101.6 kg)   BMI 37.28 kg/m  NAD.  Alert, oriented.  Lungs clear.  Heart regular rate and rhythm.  No CVA or flank tenderness.  Abdomen soft nondistended with minimal suprapubic area discomfort. Results for orders placed or performed in visit on 02/09/18  POCT urinalysis dipstick  Result Value Ref Range   Color, UA     Clarity, UA     Glucose, UA     Bilirubin, UA     Ketones, UA     Spec Grav, UA <=1.005 (A) 1.010 - 1.025   Blood, UA trace    pH, UA 6.0 5.0 - 8.0   Protein, UA     Urobilinogen, UA  0.2 or 1.0 E.U./dL   Nitrite, UA     Leukocytes, UA Trace (A) Negative   Appearance     Odor     Urine microscopic rare WBC and RBC.  Assessment:  Dysuria - Plan: POCT urinalysis dipstick, Urine Culture    Plan:   Meds ordered this encounter  Medications  . ciprofloxacin (CIPRO) 500 MG tablet    Sig: Take 1 tablet (500 mg total) by mouth 2 (two) times daily.    Dispense:  14 tablet    Refill:  0    Order Specific Question:   Supervising Provider    Answer:   Merlyn AlbertLUKING, WILLIAM S [[id number]]   Start Cipro as directed.  Urine culture pending.  Take AZO for the next 48 hours then discontinue.  Warning signs reviewed.  Call back in 72 hours if no improvement, sooner if worse.

## 2018-02-12 LAB — URINE CULTURE

## 2018-02-16 ENCOUNTER — Other Ambulatory Visit: Payer: Self-pay | Admitting: Nurse Practitioner

## 2018-03-05 ENCOUNTER — Other Ambulatory Visit: Payer: Self-pay | Admitting: Family Medicine

## 2018-08-03 ENCOUNTER — Other Ambulatory Visit: Payer: Self-pay | Admitting: Family Medicine

## 2018-12-18 ENCOUNTER — Other Ambulatory Visit: Payer: Self-pay | Admitting: Family Medicine

## 2019-02-12 ENCOUNTER — Telehealth: Payer: Self-pay | Admitting: Family Medicine

## 2019-02-12 NOTE — Telephone Encounter (Signed)
Fax from pharmacy requesting refill on Metformin 500 mg tablets. Take one tablet by mouth twice daily with meals.

## 2019-02-14 NOTE — Telephone Encounter (Signed)
May have 30-day, needs office visit 

## 2019-02-15 ENCOUNTER — Other Ambulatory Visit: Payer: Self-pay

## 2019-02-15 MED ORDER — METFORMIN HCL 500 MG PO TABS: ORAL_TABLET | ORAL | 0 refills | Status: DC

## 2019-02-15 NOTE — Telephone Encounter (Signed)
Medication sent to pharmacy  

## 2019-06-10 ENCOUNTER — Other Ambulatory Visit: Payer: Self-pay | Admitting: Family Medicine

## 2019-06-10 NOTE — Telephone Encounter (Signed)
Please schedule virtual visit and then let nurses know so we can send in refill

## 2019-06-10 NOTE — Telephone Encounter (Signed)
Please call pt and schedule an appt; then route to nurses. Thank you

## 2019-06-10 NOTE — Telephone Encounter (Signed)
One mo, needs virt appt

## 2019-06-10 NOTE — Telephone Encounter (Signed)
Left message to schedule appointment

## 2019-06-10 NOTE — Telephone Encounter (Signed)
Appt scheduled, pt will not run out of medicine before visit

## 2019-06-10 NOTE — Telephone Encounter (Signed)
LEFT MSG FOR PATIENT TO CALL us TO SCHEDULE AN APPT FOR MEDS REFILL.

## 2019-06-16 ENCOUNTER — Other Ambulatory Visit: Payer: Self-pay

## 2019-06-16 ENCOUNTER — Ambulatory Visit (INDEPENDENT_AMBULATORY_CARE_PROVIDER_SITE_OTHER): Payer: Self-pay | Admitting: Family Medicine

## 2019-06-16 DIAGNOSIS — R7303 Prediabetes: Secondary | ICD-10-CM

## 2019-06-16 DIAGNOSIS — I10 Essential (primary) hypertension: Secondary | ICD-10-CM

## 2019-06-16 MED ORDER — METFORMIN HCL 500 MG PO TABS: ORAL_TABLET | ORAL | 5 refills | Status: DC

## 2019-06-16 MED ORDER — LISINOPRIL 5 MG PO TABS: 5.0000 mg | ORAL_TABLET | Freq: Every day | ORAL | 1 refills | Status: DC

## 2019-06-16 NOTE — Progress Notes (Signed)
   Subjective:    Patient ID: Michele Cook, female    DOB: [birth year], 58 y.o.   MRN: [id number] Audio only Hypertension This is a chronic problem. The current episode started more than 1 year ago. Risk factors for coronary artery disease include post-menopausal state. Treatments tried: lisinopril. There are no compliance problems.    Patient states he hands and feet have been getting cold off and on lately and it worries her for neuropathy. Numb feeling on the tips of the fingers    Patient has also decreased her Metformin to once a day because she has been hearing about it causing cancer and cancer already runs in her family  Virtual Visit via Video Note  I connected with Michele Cook on 06/16/19 at  9:30 AM EDT by a video enabled telemedicine application and verified that I am speaking with the correct person using two identifiers.  Location: Patient: home Provider: office   I discussed the limitations of evaluation and management by telemedicine and the availability of in person appointments. The patient expressed understanding and agreed to proceed.  History of Present Illness:    Observations/Objective:   Assessment and Plan:   Follow Up Instructions:    I discussed the assessment and treatment plan with the patient. The patient was provided an opportunity to ask questions and all were answered. The patient agreed with the plan and demonstrated an understanding of the instructions.   The patient was advised to call back or seek an in-person evaluation if the symptoms worsen or if the condition fails to improve as anticipated.  I provided 18 minutes of non-face-to-face time during this encounter.   Blood pressure medicine and blood pressure levels reviewed today with patient. Compliant with blood pressure medicine. States does not miss a dose. No obvious side effects. Blood pressure generally good when checked elsewhere. Watching salt intake.  Pt walking some     Review of Systems No headache, no major weight loss or weight gain, no chest pain no back pain abdominal pain no change in bowel habits complete ROS otherwise negative     Objective:   Physical Exam   Virtual     Assessment & Plan:  Impression hypertension overall good control discussed maintain same meds  2.  Prediabetes.  Patient has backed off on Metformin because of cancer concerns told her we need to increase back to her usual dose  3.  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

## 2020-01-12 ENCOUNTER — Encounter: Payer: Self-pay | Admitting: Family Medicine

## 2020-03-06 ENCOUNTER — Other Ambulatory Visit: Payer: Self-pay | Admitting: Family Medicine

## 2020-05-03 LAB — HM DIABETES EYE EXAM

## 2021-04-22 DIAGNOSIS — Z029 Encounter for administrative examinations, unspecified: Secondary | ICD-10-CM

## 2021-05-25 ENCOUNTER — Ambulatory Visit: Payer: Self-pay | Admitting: Family Medicine

## 2021-08-20 ENCOUNTER — Other Ambulatory Visit: Payer: Self-pay

## 2021-08-20 ENCOUNTER — Encounter (HOSPITAL_COMMUNITY): Payer: Self-pay | Admitting: Emergency Medicine

## 2021-08-20 ENCOUNTER — Emergency Department (HOSPITAL_COMMUNITY)
Admission: EM | Admit: 2021-08-20 | Discharge: 2021-08-20 | Disposition: A | Discharge status: Home / Self Care | Payer: Self-pay | Attending: Emergency Medicine | Admitting: Emergency Medicine

## 2021-08-20 DIAGNOSIS — Z7982 Long term (current) use of aspirin: Secondary | ICD-10-CM | POA: Insufficient documentation

## 2021-08-20 DIAGNOSIS — R21 Rash and other nonspecific skin eruption: Secondary | ICD-10-CM | POA: Insufficient documentation

## 2021-08-20 DIAGNOSIS — E119 Type 2 diabetes mellitus without complications: Secondary | ICD-10-CM | POA: Insufficient documentation

## 2021-08-20 DIAGNOSIS — I10 Essential (primary) hypertension: Secondary | ICD-10-CM | POA: Insufficient documentation

## 2021-08-20 DIAGNOSIS — Z7984 Long term (current) use of oral hypoglycemic drugs: Secondary | ICD-10-CM | POA: Insufficient documentation

## 2021-08-20 LAB — CBG MONITORING, ED: Glucose-Capillary: 106 mg/dL — ABNORMAL HIGH (ref 70–99)

## 2021-08-20 MED ORDER — PREDNISONE 10 MG PO TABS: ORAL_TABLET | ORAL | 0 refills | Status: DC

## 2021-08-20 MED ORDER — DEXAMETHASONE SODIUM PHOSPHATE 10 MG/ML IJ SOLN
10.0000 mg | Freq: Once | INTRAMUSCULAR | Status: AC
  Administered 2021-08-20: 10 mg via INTRAMUSCULAR
  Filled 2021-08-20: qty 1

## 2021-08-20 NOTE — ED Provider Notes (Signed)
[facility] EMERGENCY DEPARTMENT Provider Note   CSN: [id number] Arrival date & time: 08/20/21  0940     History Chief Complaint  Patient presents with   Allergic Reaction    Michele Cook is a 60 y.o. female presenting for evaluation of a rash which started about a week ago on her left forearm and hand and has now developed a pruritic rash on her left cheek and ear along with swelling around her left eye.  She also noticed a few spots of rash starting on her right cheek as well.  She has been helping a woman clean her home by removing trash.  She has not been in contact with any chemicals, she is simply removing bags of trash from the home.  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  She did have dogs in her home but not in the last 5 years.  Patient has not recognize any bug infestation in the home, no rash on her legs.  She also denies fevers or chills, shortness of breath.  She did try a new soap several weeks ago but is not using currently.  She has applied topical hydrocortisone cream and also has taken oral Benadryl with no significant improvement in her symptoms.  The history is provided by the patient.      Past Medical History:  Diagnosis Date   Diabetes mellitus without complication [facility])     Patient Active Problem List   Diagnosis Date Noted   Essential hypertension 05/19/2017   Prediabetes 05/08/2015    History reviewed. No pertinent surgical history.   OB History   No obstetric history on file.     Family History  Problem Relation Age of Onset   Heart disease Mother        bypass surgery 52's   Colon cancer Mother    Hypertension Mother    Hyperlipidemia Mother    Diabetes Mother    Hyperlipidemia Father    Hypertension Father    Stroke Father    Heart disease Brother    Hypertension Brother    Heart disease Maternal Grandmother    Heart disease Maternal Grandfather    Diabetes Paternal Grandmother    Diabetes Paternal Grandfather    Heart  disease Paternal Grandfather     Social History   Tobacco Use   Smoking status: Never   Smokeless tobacco: Never    Home Medications Prior to Admission medications   Medication Sig Start Date End Date Taking? Authorizing Provider  predniSONE (DELTASONE) 10 MG tablet 6, 5, 4, 3, 2 then 1 tablet by mouth daily for 6 days total. 08/20/21  Yes Reba Hulett, Fidela Juneau  aspirin EC 81 MG tablet Take 1 tablet (81 mg total) by mouth daily. 06/25/17   Antoine Poche, MD  lisinopril (ZESTRIL) 5 MG tablet Take 1 tablet (5 mg total) by mouth daily. 06/16/19   Merlyn Albert, MD  metFORMIN (GLUCOPHAGE) 500 MG tablet TAKE 1 TABLET BY MOUTH TWICE DAILY WITH MEALS 06/16/19   Merlyn Albert, MD  naproxen (NAPROSYN) 500 MG tablet TAKE 1 TABLET BY MOUTH TWICE DAILY WITH MEALS 03/07/20   Merlyn Albert, MD    Allergies    Sulfa antibiotics  Review of Systems   Review of Systems  Constitutional:  Negative for chills and fever.  Respiratory:  Negative for shortness of breath and wheezing.   Skin:  Positive for rash.  Neurological:  Negative for numbness.  All other systems reviewed and  are negative.  Physical Exam Updated Vital Signs BP (!) 173/97 (BP Location: Right Arm)   Pulse 66   Temp 98.4 F (36.9 C) (Oral)   Resp 16   Ht 5\' 5"  (1.651 m)   Wt 95.3 kg   SpO2 98%   BMI 34.95 kg/m   Physical Exam Constitutional:      General: She is not in acute distress.    Appearance: She is well-developed.  HENT:     Head: Normocephalic.  Cardiovascular:     Rate and Rhythm: Normal rate.  Pulmonary:     Effort: Pulmonary effort is normal.  Musculoskeletal:        General: Normal range of motion.     Cervical back: Neck supple.  Skin:    Findings: Rash present.     Comments: Several scattered small erythematous papules on left volar forearm and upper arm including fingers of this hand, there is no tunneling, no pustules, no surrounding erythema.  There is a mild rash noted at her right  antecubital space.  Her left cheek has a confluence of erythematous papules.  She has a pale ecchymosis like edema of her left lower eyelid, generalized mild edema with several papules also of her left ear rim.    ED Results / Procedures / Treatments   Labs (all labs ordered are listed, but only abnormal results are displayed) Labs Reviewed  CBG MONITORING, ED - Abnormal; Notable for the following components:      Result Value   Glucose-Capillary 106 (*)    All other components within normal limits    EKG None  Radiology No results found.  Procedures Procedures   Medications Ordered in ED Medications  dexamethasone (DECADRON) injection 10 mg (has no administration in time range)    ED Course  I have reviewed the triage vital signs and the nursing notes.  Pertinent labs & imaging results that were available during my care of the patient were reviewed by me and considered in my medical decision making (see chart for details).    MDM Rules/Calculators/A&P                           Patient with a nonspecific rash, it appears inflammatory however.  She has been helping to clean a woman's home which she states is very dirty, she used to on pets but more than 5 years ago, the home owner does not have a similar rash, I doubt this represents scabies, although she does have some isolated papules on her forearms and fingers, the left facial rash is definitely more inflammatory.  She was given a Decadron IM injection, followed by a 6-day prednisone taper.  Plan follow-up with her PCP for recheck in a week if not improving.  She will keep close watch on her CBGs while she is on this medication. Final Clinical Impression(s) / ED Diagnoses Final diagnoses:  Rash    Rx / DC Orders ED Discharge Orders          Ordered    predniSONE (DELTASONE) 10 MG tablet        08/20/21 1152             10/20/21, PA-C 08/20/21 1200    10/20/21, MD 08/23/21 [phone]

## 2021-08-20 NOTE — Discharge Instructions (Addendum)
As discussed, your rash suggest some type of direct contact with either a chemical, lotion or other trigger causing hyper sensitivity.  You have been placed on prednisone to help alleviate this reaction.  You have been given a Decadron injection today, therefore do not start taking the prednisone tablets until tomorrow.  Keep a close watch on your blood glucose levels while you are on this medication as this can elevate your blood glucose levels.  I recommend continuing cool compresses or ice packs to your cheek and ear area to help with itching and swelling.  You may also use a topical itch relief medication, I recommend Goldbond anti-itch cream, however do not apply this near your eyes.  Get rechecked for any worsening symptoms, if this does not completely improve with today's treatment plan plan to see your doctor for recheck in 1 week.

## 2021-08-20 NOTE — ED Triage Notes (Addendum)
Pt states she is having an allergic reaction to something, she states she doesn't know what it could be, it started about a week ago. She has had swelling of the left eye and ear and itching all over body.

## 2022-01-07 ENCOUNTER — Telehealth: Payer: Self-pay | Admitting: Family Medicine

## 2023-04-22 ENCOUNTER — Ambulatory Visit (HOSPITAL_COMMUNITY)
Admission: RE | Admit: 2023-04-22 | Discharge: 2023-04-22 | Disposition: A | Discharge status: Home / Self Care | Payer: Medicaid Other | Source: Ambulatory Visit | Attending: Family Medicine | Admitting: Family Medicine

## 2023-04-22 ENCOUNTER — Ambulatory Visit (INDEPENDENT_AMBULATORY_CARE_PROVIDER_SITE_OTHER): Payer: Medicaid Other | Admitting: Family Medicine

## 2023-04-22 VITALS — BP 125/87 | HR 73 | Temp 97.0°F | Ht 65.0 in | Wt 226.0 lb

## 2023-04-22 DIAGNOSIS — R7303 Prediabetes: Secondary | ICD-10-CM

## 2023-04-22 DIAGNOSIS — Z13220 Encounter for screening for lipoid disorders: Secondary | ICD-10-CM

## 2023-04-22 DIAGNOSIS — M25512 Pain in left shoulder: Secondary | ICD-10-CM

## 2023-04-22 DIAGNOSIS — M150 Primary generalized (osteo)arthritis: Secondary | ICD-10-CM

## 2023-04-22 DIAGNOSIS — M159 Polyosteoarthritis, unspecified: Secondary | ICD-10-CM | POA: Diagnosis not present

## 2023-04-22 DIAGNOSIS — Z130 Encounter for screening for diseases of the blood and blood-forming organs and certain disorders involving the immune mechanism: Secondary | ICD-10-CM

## 2023-04-22 DIAGNOSIS — M1990 Unspecified osteoarthritis, unspecified site: Secondary | ICD-10-CM | POA: Insufficient documentation

## 2023-04-22 MED ORDER — BACLOFEN 10 MG PO TABS: 5.0000 mg | ORAL_TABLET | Freq: Three times a day (TID) | ORAL | 0 refills | Status: AC | PRN

## 2023-04-22 MED ORDER — MELOXICAM 15 MG PO TABS: 15.0000 mg | ORAL_TABLET | Freq: Every day | ORAL | 0 refills | Status: DC | PRN

## 2023-04-22 NOTE — Progress Notes (Signed)
Subjective:  Patient ID: Michele Cook, female    DOB: 12-10-1960  Age: 62 y.o. MRN: [id number]  CC:  Establish; hand pain, shoulder pain   HPI:  62 year old female presents for evaluation of the above.   Patient reports that for the past 3 to 4 weeks she has had left shoulder pain.  Located laterally and extends to the left trapezius and to the neck.  Painful with certain ranges of motion.  Patient also reports that she has had pain in the base of her left and right thumbs.  She has been using naproxen.  Patient Active Problem List   Diagnosis Date Noted   Acute pain of left shoulder 04/22/2023   Osteoarthritis 04/22/2023   Prediabetes 05/08/2015    Social Hx   Social History   Socioeconomic History   Marital status: Single    Spouse name: Not on file   Number of children: Not on file   Years of education: Not on file   Highest education level: Not on file  Occupational History   Not on file  Tobacco Use   Smoking status: Never   Smokeless tobacco: Never  Substance and Sexual Activity   Alcohol use: Not on file   Drug use: Not on file   Sexual activity: Not on file  Other Topics Concern   Not on file  Social History Narrative   Not on file   Social Determinants of Health   Financial Resource Strain: Not on file  Food Insecurity: Not on file  Transportation Needs: Not on file  Physical Activity: Not on file  Stress: Not on file  Social Connections: Not on file    Review of Systems Per HPI  Objective:  BP 125/87   Pulse 73   Temp (!) 97 F (36.1 C)   Ht 5\' 5"  (1.651 m)   Wt 226 lb (102.5 kg)   SpO2 96%   BMI 37.61 kg/m      04/22/2023   11:12 AM 08/20/2021   12:24 PM 08/20/2021   10:04 AM  BP/Weight  Systolic BP 125 151   Diastolic BP 87 94   Wt. (Lbs) 226  210  BMI 37.61 kg/m2  34.95 kg/m2    Physical Exam Vitals and nursing note reviewed.  Constitutional:      General: She is not in acute distress.    Appearance: Normal appearance.   HENT:     Head: Normocephalic and atraumatic.  Cardiovascular:     Rate and Rhythm: Normal rate and regular rhythm.  Pulmonary:     Effort: Pulmonary effort is normal.     Breath sounds: Normal breath sounds. No wheezing, rhonchi or rales.  Musculoskeletal:     Comments: Shoulder: Inspection reveals no abnormalities, atrophy or asymmetry. Palpation is normal with no tenderness over AC joint or bicipital groove. ROM is decreased in flexion. Rotator cuff strength normal throughout. Positive Hawkins.  Patient with tenderness over the [facility] joints.  Neurological:     Mental Status: She is alert.     Lab Results  Component Value Date   WBC 5.3 01/23/2017   HGB 14.9 01/23/2017   HCT 43.6 01/23/2017   PLT 231 01/23/2017   GLUCOSE 93 01/23/2017   CHOL 199 05/01/2015   TRIG 174 (H) 05/01/2015   HDL 49 05/01/2015   LDLCALC 115 (H) 05/01/2015   ALT 21 01/23/2017   AST 20 01/23/2017   NA 142 01/23/2017   K 4.6 01/23/2017  CL 102 01/23/2017   CREATININE 0.74 01/23/2017   BUN 17 01/23/2017   CO2 25 01/23/2017   HGBA1C 4.9 05/19/2017     Assessment & Plan:   Problem List Items Addressed This Visit       Musculoskeletal and Integument   Osteoarthritis    Mobic as prescribed.      Relevant Medications   meloxicam (MOBIC) 15 MG tablet   baclofen (LIORESAL) 10 MG tablet     Other   Prediabetes   Relevant Orders   CMP14+EGFR   Hemoglobin A1c   Acute pain of left shoulder - Primary    Likely rotator cuff bursitis, tendinitis or tendinopathy. Xray today.  Trial of Mobic.      Relevant Orders   DG Shoulder Left (Completed)   Other Visit Diagnoses     Screening for deficiency anemia       Relevant Orders   CBC   Screening, lipid       Relevant Orders   Lipid panel       Meds ordered this encounter  Medications   meloxicam (MOBIC) 15 MG tablet    Sig: Take 1 tablet (15 mg total) by mouth daily as needed for pain.    Dispense:  30 tablet    Refill:  0    baclofen (LIORESAL) 10 MG tablet    Sig: Take 0.5-1 tablets (5-10 mg total) by mouth 3 (three) times daily as needed for muscle spasms.    Dispense:  30 each    Refill:  0    Follow-up:  Return in about 3 months (around 07/23/2023).  Everlene Other DO [facility]

## 2023-04-22 NOTE — Assessment & Plan Note (Signed)
Likely rotator cuff bursitis, tendinitis or tendinopathy. Xray today.  Trial of Mobic.

## 2023-04-22 NOTE — Assessment & Plan Note (Signed)
Mobic as prescribed. 

## 2023-04-22 NOTE — Patient Instructions (Signed)
Labs at [facility].  Herby Abraham at the hospital.  Medication as prescribed.  Follow up in 3 months.

## 2023-04-23 LAB — CMP14+EGFR
ALT: 32 IU/L (ref 0–32)
AST: 26 IU/L (ref 0–40)
Albumin/Globulin Ratio: 1.8 (ref 1.2–2.2)
Albumin: 4.6 g/dL (ref 3.8–4.9)
Alkaline Phosphatase: 116 IU/L (ref 44–121)
BUN/Creatinine Ratio: 15 (ref 12–28)
BUN: 12 mg/dL (ref 8–27)
Bilirubin Total: 0.5 mg/dL (ref 0.0–1.2)
CO2: 25 mmol/L (ref 20–29)
Calcium: 10.4 mg/dL — ABNORMAL HIGH (ref 8.7–10.3)
Chloride: 101 mmol/L (ref 96–106)
Creatinine, Ser: 0.82 mg/dL (ref 0.57–1.00)
Globulin, Total: 2.6 g/dL (ref 1.5–4.5)
Glucose: 100 mg/dL — ABNORMAL HIGH (ref 70–99)
Potassium: 5 mmol/L (ref 3.5–5.2)
Sodium: 140 mmol/L (ref 134–144)
Total Protein: 7.2 g/dL (ref 6.0–8.5)
eGFR: 82 mL/min/{1.73_m2} (ref >59)

## 2023-04-23 LAB — LIPID PANEL
Chol/HDL Ratio: 4 ratio (ref 0.0–4.4)
Cholesterol, Total: 233 mg/dL — ABNORMAL HIGH (ref 100–199)
HDL: 58 mg/dL
LDL Chol Calc (NIH): 135 mg/dL — ABNORMAL HIGH (ref 0–99)
Triglycerides: 228 mg/dL — ABNORMAL HIGH (ref 0–149)
VLDL Cholesterol Cal: 40 mg/dL (ref 5–40)

## 2023-04-23 LAB — CBC
Hematocrit: 47.9 % — ABNORMAL HIGH (ref 34.0–46.6)
Hemoglobin: 15.9 g/dL (ref 11.1–15.9)
MCH: 28.7 pg (ref 26.6–33.0)
MCHC: 33.2 g/dL (ref 31.5–35.7)
MCV: 87 fL (ref 79–97)
Platelets: 258 10*3/uL (ref 150–450)
RBC: 5.54 x10E6/uL — ABNORMAL HIGH (ref 3.77–5.28)
RDW: 12.8 % (ref 11.7–15.4)
WBC: 6.4 10*3/uL (ref 3.4–10.8)

## 2023-04-23 LAB — HEMOGLOBIN A1C
Est. average glucose Bld gHb Est-mCnc: 131 mg/dL
Hgb A1c MFr Bld: 6.2 % — ABNORMAL HIGH (ref 4.8–5.6)

## 2023-04-29 ENCOUNTER — Encounter: Payer: Self-pay | Admitting: *Deleted

## 2023-04-29 NOTE — Addendum Note (Signed)
Addended by: Margaretha Sheffield on: 04/29/2023 03:01 PM   Modules accepted: Orders

## 2023-05-01 ENCOUNTER — Other Ambulatory Visit: Payer: Self-pay | Admitting: Family Medicine

## 2023-05-01 MED ORDER — DICLOFENAC SODIUM 75 MG PO TBEC: 75.0000 mg | DELAYED_RELEASE_TABLET | Freq: Two times a day (BID) | ORAL | 0 refills | Status: DC | PRN

## 2023-05-07 LAB — PTH, INTACT AND CALCIUM
Calcium: 9.2 mg/dL (ref 8.7–10.3)
PTH: 42 pg/mL (ref 15–65)

## 2023-05-08 ENCOUNTER — Other Ambulatory Visit: Payer: Self-pay | Admitting: Family Medicine

## 2023-06-16 ENCOUNTER — Telehealth: Payer: Self-pay

## 2023-06-16 NOTE — Telephone Encounter (Signed)
Chart review completed for patient. Patient is due for screening mammogram. Mychart message sent to patient to inquire about scheduling mammogram.  Adeleigh Barletta, Population Health Specialist.  

## 2023-07-23 ENCOUNTER — Ambulatory Visit: Payer: Medicaid Other | Admitting: Family Medicine

## 2023-07-24 ENCOUNTER — Ambulatory Visit (INDEPENDENT_AMBULATORY_CARE_PROVIDER_SITE_OTHER): Payer: Medicaid Other | Admitting: Family Medicine

## 2023-07-24 DIAGNOSIS — Z1211 Encounter for screening for malignant neoplasm of colon: Secondary | ICD-10-CM | POA: Diagnosis not present

## 2023-07-24 DIAGNOSIS — R7303 Prediabetes: Secondary | ICD-10-CM

## 2023-07-24 DIAGNOSIS — E785 Hyperlipidemia, unspecified: Secondary | ICD-10-CM

## 2023-07-24 DIAGNOSIS — M159 Polyosteoarthritis, unspecified: Secondary | ICD-10-CM

## 2023-07-24 NOTE — Assessment & Plan Note (Addendum)
Stable.  Continue use of Aspercreme.  Diclofenac discontinued.

## 2023-07-24 NOTE — Progress Notes (Signed)
Subjective:  Patient ID: Michele Cook, female    DOB: 03-Mar-1961  Age: 62 y.o. MRN: [id number]  CC:  Follow up  HPI:  62 year old female with osteoarthritis [place], hyperlipidemia presents for follow-up.  Has had issues with left shoulder pain.  Has mild underlying arthritis.  She states that it is currently improved in response to Aspercreme.  Recent A1c 6.2.  Had recent hypercalcemia but this was improved on recheck.  Calcium now within normal limits.  Lipids uncontrolled.  The 10-year ASCVD risk score (Arnett DK, et al., 2019) is: 4.6%   Values used to calculate the score:     Age: 72 years     Sex: Female     Is Non-Hispanic African American: No     Diabetic: No     Tobacco smoker: No     Systolic Blood Pressure: 138 mmHg     Is BP treated: No     HDL Cholesterol: 58 mg/dL     Total Cholesterol: 233 mg/dL  ASCVD rescore as above.  Low risk at this time.  Dietary/lifestyle changes.  Patient is overdue for multiple preventative healthcare items.  Patient refused colonoscopy.  She is amenable to Cologuard.  She is overdue for mammogram and Pap smear.  Information given regarding scheduling her mammogram.  Needs to schedule Pap smear.  Patient Active Problem List   Diagnosis Date Noted   Hyperlipidemia 07/24/2023   Osteoarthritis 04/22/2023   Prediabetes 05/08/2015    Social Hx   Social History   Socioeconomic History   Marital status: Single    Spouse name: Not on file   Number of children: Not on file   Years of education: Not on file   Highest education level: Not on file  Occupational History   Not on file  Tobacco Use   Smoking status: Never   Smokeless tobacco: Never  Substance and Sexual Activity   Alcohol use: Not on file   Drug use: Not on file   Sexual activity: Not on file  Other Topics Concern   Not on file  Social History Narrative   Not on file   Social Determinants of Health   Financial Resource Strain: Not on file  Food  Insecurity: Not on file  Transportation Needs: Not on file  Physical Activity: Not on file  Stress: Not on file  Social Connections: Unknown (04/23/2022)   Received from [employer]   Social Network    Social Network: Not on file    Review of Systems Per HPI  Objective:  BP 138/87   Pulse 62   Temp 97.9 F (36.6 C)   Ht 5\' 5"  (1.651 m)   Wt 232 lb 3.2 oz (105.3 kg)   SpO2 98%   BMI 38.64 kg/m      07/24/2023   11:44 AM 07/24/2023   11:00 AM 04/22/2023   11:12 AM  BP/Weight  Systolic BP 138 160 125  Diastolic BP 87 96 87  Wt. (Lbs)  232.2 226  BMI  38.64 kg/m2 37.61 kg/m2    Physical Exam Vitals and nursing note reviewed.  Constitutional:      General: She is not in acute distress.    Appearance: Normal appearance.  HENT:     Head: Normocephalic and atraumatic.  Eyes:     General:        Right eye: No discharge.        Left eye: No discharge.     Conjunctiva/sclera: Conjunctivae normal.  Cardiovascular:     Rate and Rhythm: Normal rate and regular rhythm.  Pulmonary:     Effort: Pulmonary effort is normal.     Breath sounds: Normal breath sounds. No wheezing, rhonchi or rales.  Neurological:     Mental Status: She is alert.  Psychiatric:        Mood and Affect: Mood normal.        Behavior: Behavior normal.     Lab Results  Component Value Date   WBC 6.4 04/22/2023   HGB 15.9 04/22/2023   HCT 47.9 (H) 04/22/2023   PLT 258 04/22/2023   GLUCOSE 100 (H) 04/22/2023   CHOL 233 (H) 04/22/2023   TRIG 228 (H) 04/22/2023   HDL 58 04/22/2023   LDLCALC 135 (H) 04/22/2023   ALT 32 04/22/2023   AST 26 04/22/2023   NA 140 04/22/2023   K 5.0 04/22/2023   CL 101 04/22/2023   CREATININE 0.82 04/22/2023   BUN 12 04/22/2023   CO2 25 04/22/2023   HGBA1C 6.2 (H) 04/22/2023     Assessment & Plan:   Problem List Items Addressed This Visit       Musculoskeletal and Integument   Osteoarthritis    Stable.  Continue use of Aspercreme.  Diclofenac  discontinued.        Other   Prediabetes    Stable.  Will continue to monitor.      Hyperlipidemia    Low risk.  Will continue to monitor.  Dietary/lifestyle changes.      Other Visit Diagnoses     Colon cancer screening       Relevant Orders   Cologuard      Follow-up:  Annually  Everlene Other DO [facility]

## 2023-07-24 NOTE — Assessment & Plan Note (Signed)
Stable. Will continue to monitor.

## 2023-07-24 NOTE — Patient Instructions (Signed)
Cologuard ordered.  Call [phone] to schedule mammogram.  Schedule an appt with Eber Jones for your pap smear.  Follow up annually.  Take care  Dr. Adriana Simas

## 2023-07-24 NOTE — Assessment & Plan Note (Signed)
Low risk.  Will continue to monitor.  Dietary/lifestyle changes.

## 2023-08-04 ENCOUNTER — Other Ambulatory Visit: Payer: Self-pay | Admitting: Nurse Practitioner

## 2023-08-04 ENCOUNTER — Telehealth: Payer: Self-pay | Admitting: Family Medicine

## 2023-08-04 MED ORDER — HYDROCODONE BIT-HOMATROP MBR 5-1.5 MG/5ML PO SOLN: 5.0000 mL | Freq: Four times a day (QID) | ORAL | 0 refills | Status: AC | PRN

## 2023-08-04 NOTE — Telephone Encounter (Signed)
[name], NP     Please review COVID informaton with patient. Does she want a cough med? Michele Cook

## 2023-08-04 NOTE — Telephone Encounter (Signed)
Left message to return call 

## 2023-08-04 NOTE — Telephone Encounter (Signed)
Done

## 2023-08-04 NOTE — Telephone Encounter (Signed)
Patient tested positive for Covid yesterday on with cough ,congestion. [facility] [place]

## 2023-08-04 NOTE — Telephone Encounter (Signed)
Patient states she would like script for Hycodan cough med sent to [facility] in [place]- It always helps her cough

## 2023-09-02 DIAGNOSIS — Z1211 Encounter for screening for malignant neoplasm of colon: Secondary | ICD-10-CM | POA: Diagnosis not present

## 2023-09-06 LAB — COLOGUARD: COLOGUARD: NEGATIVE
